# Patient Record
Sex: Male | Born: 1980 | Race: Black or African American | Hispanic: No | Marital: Married | Smoking: Never smoker
Health system: Southern US, Community
[De-identification: ages and names within clinical notes are randomized; demographics above are authoritative.]

## PROBLEM LIST (undated history)

## (undated) DIAGNOSIS — J302 Other seasonal allergic rhinitis: Secondary | ICD-10-CM

## (undated) HISTORY — PX: MANDIBLE FRACTURE SURGERY: SHX706

---
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
## 2000-04-11 ENCOUNTER — Encounter: Payer: Self-pay | Admitting: Emergency Medicine

## 2000-04-11 ENCOUNTER — Emergency Department (HOSPITAL_COMMUNITY): Admission: EM | Admit: 2000-04-11 | Discharge: 2000-04-11 | Payer: Self-pay | Admitting: Emergency Medicine

## 2000-08-31 ENCOUNTER — Emergency Department (HOSPITAL_COMMUNITY): Admission: EM | Admit: 2000-08-31 | Discharge: 2000-08-31 | Payer: Self-pay | Admitting: Emergency Medicine

## 2001-10-07 ENCOUNTER — Emergency Department (HOSPITAL_COMMUNITY): Admission: EM | Admit: 2001-10-07 | Discharge: 2001-10-07 | Payer: Self-pay | Admitting: Emergency Medicine

## 2003-08-04 ENCOUNTER — Emergency Department (HOSPITAL_COMMUNITY): Admission: EM | Admit: 2003-08-04 | Discharge: 2003-08-04 | Payer: Self-pay | Admitting: Emergency Medicine

## 2004-04-19 ENCOUNTER — Emergency Department (HOSPITAL_COMMUNITY): Admission: EM | Admit: 2004-04-19 | Discharge: 2004-04-19 | Payer: Self-pay | Admitting: Emergency Medicine

## 2004-12-12 ENCOUNTER — Emergency Department (HOSPITAL_COMMUNITY): Admission: EM | Admit: 2004-12-12 | Discharge: 2004-12-12 | Payer: Self-pay | Admitting: Family Medicine

## 2005-12-03 ENCOUNTER — Emergency Department (HOSPITAL_COMMUNITY): Admission: EM | Admit: 2005-12-03 | Discharge: 2005-12-03 | Payer: Self-pay | Admitting: Emergency Medicine

## 2006-09-02 ENCOUNTER — Emergency Department (HOSPITAL_COMMUNITY): Admission: EM | Admit: 2006-09-02 | Discharge: 2006-09-02 | Payer: Self-pay | Admitting: Emergency Medicine

## 2009-03-17 ENCOUNTER — Emergency Department (HOSPITAL_COMMUNITY): Admission: EM | Admit: 2009-03-17 | Discharge: 2009-03-17 | Payer: Self-pay | Admitting: Emergency Medicine

## 2009-07-04 ENCOUNTER — Emergency Department (HOSPITAL_COMMUNITY): Admission: EM | Admit: 2009-07-04 | Discharge: 2009-07-04 | Payer: Self-pay | Admitting: Emergency Medicine

## 2009-07-13 ENCOUNTER — Emergency Department (HOSPITAL_COMMUNITY): Admission: EM | Admit: 2009-07-13 | Discharge: 2009-07-13 | Payer: Self-pay | Admitting: Emergency Medicine

## 2009-07-20 ENCOUNTER — Emergency Department (HOSPITAL_COMMUNITY): Admission: EM | Admit: 2009-07-20 | Discharge: 2009-07-20 | Payer: Self-pay | Admitting: Emergency Medicine

## 2011-09-27 ENCOUNTER — Emergency Department (INDEPENDENT_AMBULATORY_CARE_PROVIDER_SITE_OTHER)
Admission: EM | Admit: 2011-09-27 | Discharge: 2011-09-27 | Disposition: A | Discharge status: Home / Self Care | Payer: Self-pay | Source: Home / Self Care | Attending: Emergency Medicine | Admitting: Emergency Medicine

## 2011-09-27 ENCOUNTER — Encounter (HOSPITAL_COMMUNITY): Payer: Self-pay

## 2011-09-27 DIAGNOSIS — K0520 Aggressive periodontitis, unspecified: Secondary | ICD-10-CM

## 2011-09-27 DIAGNOSIS — K05219 Aggressive periodontitis, localized, unspecified severity: Secondary | ICD-10-CM

## 2011-09-27 HISTORY — DX: Other seasonal allergic rhinitis: J30.2

## 2011-09-27 MED ORDER — PENICILLIN V POTASSIUM 500 MG PO TABS: 500.0000 mg | ORAL_TABLET | Freq: Four times a day (QID) | ORAL | Status: AC

## 2011-09-27 MED ORDER — IBUPROFEN 600 MG PO TABS: 600.0000 mg | ORAL_TABLET | Freq: Four times a day (QID) | ORAL | Status: AC | PRN

## 2011-09-27 MED ORDER — CHLORHEXIDINE GLUCONATE 0.12 % MT SOLN: OROMUCOSAL | Status: AC

## 2011-09-27 NOTE — ED Notes (Signed)
C/o abscess to upper gum that started 3 weeks ago, states took mom's leftover antibiotics and it went away, then returned.  States while waiting here, it drained.

## 2011-09-27 NOTE — Discharge Instructions (Signed)
You'll need to followup with Dr. Ninetta Lights, to have this tooth taken care of . Finish all of the antibiotics, even if you feel better. Return to the ER if you start having a severe headache if you've a fever above 100.4, or for any other concerns.

## 2011-09-27 NOTE — ED Provider Notes (Signed)
History     CSN: [id number]  Arrival date & time 09/27/11  1511   First MD Initiated Contact with Patient 09/27/11 1610      Chief Complaint  Patient presents with  . Abscess    (Consider location/radiation/quality/duration/timing/severity/associated sxs/prior treatment) HPI Comments: Patient reports a recurring painful swelling above his right upper central incisor for 3 weeks. States it comes and goes. Has been using mouthwash, and brushing his teeth without symptom improvement. Was concerned   that it was getting worse, but states it spontaneously drained while in waiting room. a patient has been extensively decayed tooth right below it. Denies any tooth pain. No nausea, vomiting, fevers, headaches, nasal congestion, trismus. Patient has had this over the past few months, and has taken penicillin for this the past, but has not taken any for this episode.   ROS as noted in HPI. All other ROS negative.   Patient is a 31 y.o. male presenting with abscess. The history is provided by the patient. No language interpreter was used.  Abscess  This is a recurrent problem. The current episode started more than one week ago. The onset was gradual. The problem has been gradually improving.    Past Medical History  Diagnosis Date  . Seasonal allergies     Past Surgical History  Procedure Date  . Mandible fracture surgery     History reviewed. No pertinent family history.  History  Substance Use Topics  . Smoking status: Never Smoker   . Smokeless tobacco: Not on file  . Alcohol Use: No      Review of Systems  Allergies  Review of patient's allergies indicates no known allergies.  Home Medications   Current Outpatient Rx  Name Route Sig Dispense Refill  . MULTIVITAMINS PO CAPS Oral Take 1 capsule by mouth daily.    . CHLORHEXIDINE GLUCONATE 0.12 % MT SOLN  15 mL swish and spit bid 120 mL 0  . IBUPROFEN 600 MG PO TABS Oral Take 1 tablet (600 mg total) by mouth every 6  (six) hours as needed for pain. 30 tablet 0  . PENICILLIN V POTASSIUM 500 MG PO TABS Oral Take 1 tablet (500 mg total) by mouth 4 (four) times daily. X 10 days 40 tablet 0    BP 125/76  Pulse 70  Temp(Src) 98.1 F (36.7 C) (Oral)  Resp 12  SpO2 100%  Physical Exam  Nursing note and vitals reviewed. Constitutional: He is oriented to person, place, and time. He appears well-developed and well-nourished.  HENT:  Head: Normocephalic and atraumatic. No trismus in the jaw.  Mouth/Throat: Oropharynx is clear and moist and mucous membranes are normal.         Erythema, swelling, central hole where abscess drain. No expressible purulent drainage. Extensively decayed right upper central incisor. Tooth is nontender to palpation, and is not loose.  Eyes: Conjunctivae and EOM are normal.  Neck: Normal range of motion.  Cardiovascular: Normal rate.   Pulmonary/Chest: Effort normal. No respiratory distress.  Abdominal: He exhibits no distension.  Musculoskeletal: Normal range of motion.  Neurological: He is alert and oriented to person, place, and time.  Skin: Skin is warm and dry.  Psychiatric: He has a normal mood and affect. His behavior is normal.    ED Course  Procedures (including critical care time)  Labs Reviewed - No data to display No results found.   1. Abscess of upper gum       MDM  Abscess spontaneously drained  while in waiting room. Patient nontoxic. Will send home with penicillin, chlorhexidine mouth wash. Will have him follow with Dr. Ninetta Lights, dentist on call.  Luiz Blare, MD 09/27/11 1719

## 2011-11-21 IMAGING — CR DG FOOT COMPLETE 3+V*R*
3 series · 3 of 3 positions shown · non-contrast
Comparison: None available.

CLINICAL DATA: Occult fracture of the right foot.  Foot injury 1
week ago.  Lateral foot pain.

RIGHT FOOT COMPLETE - 3+ VIEW

[view not recorded (1 of 3)]
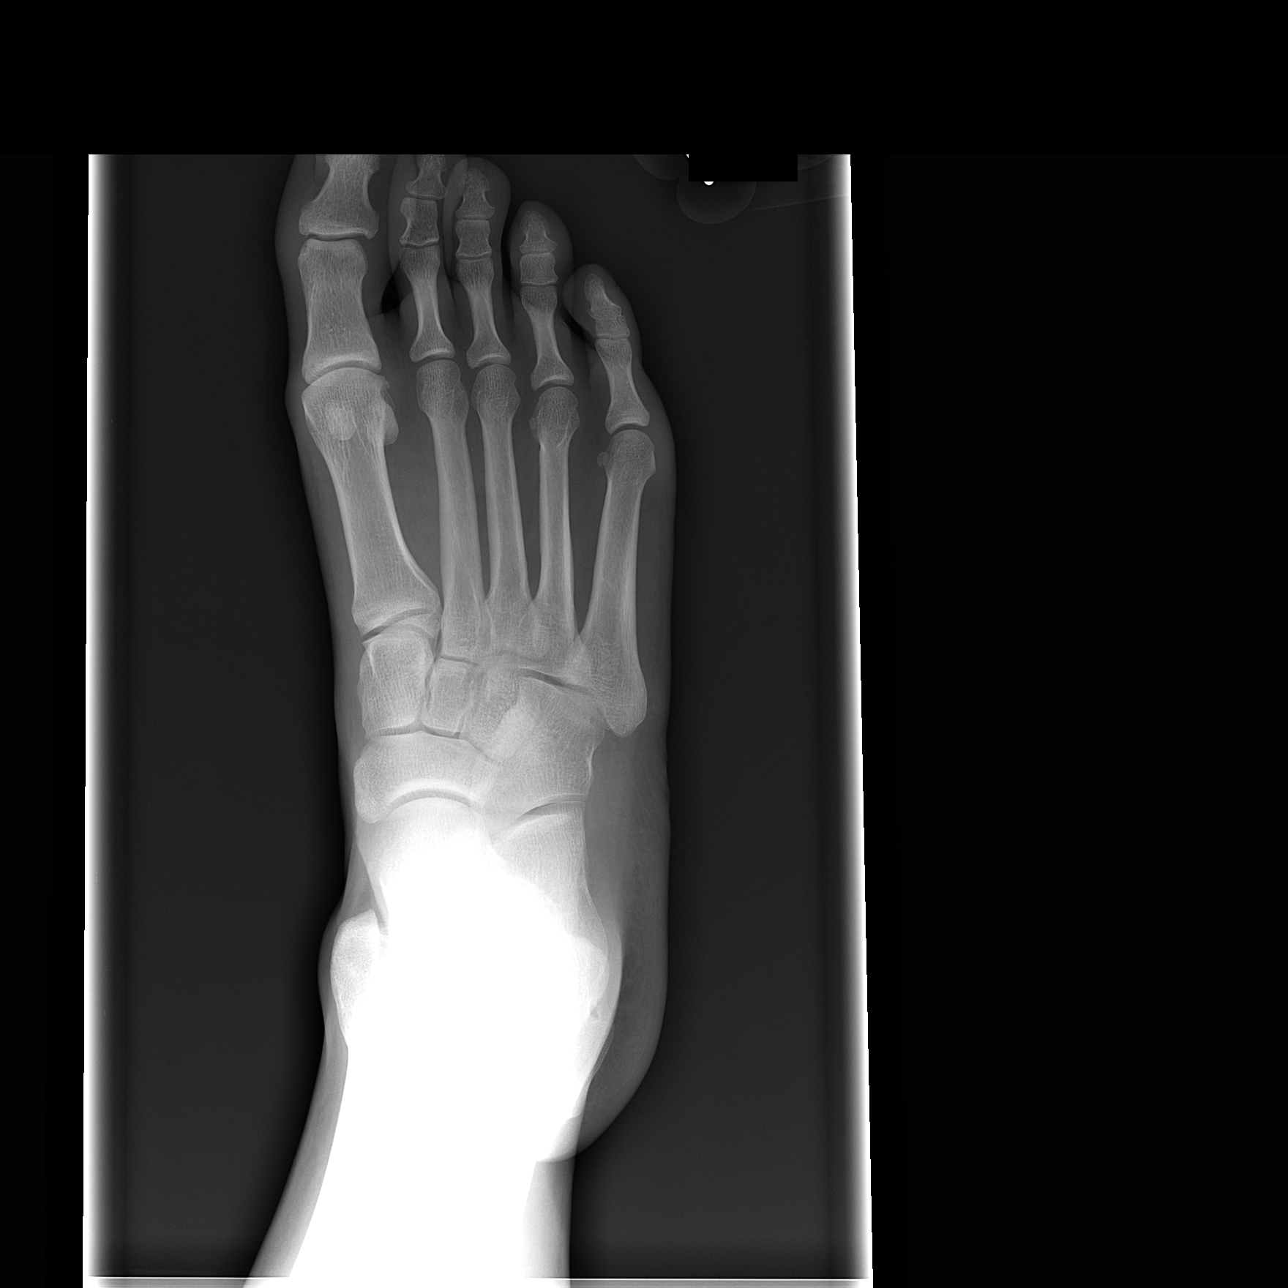

[view not recorded (2 of 3)]
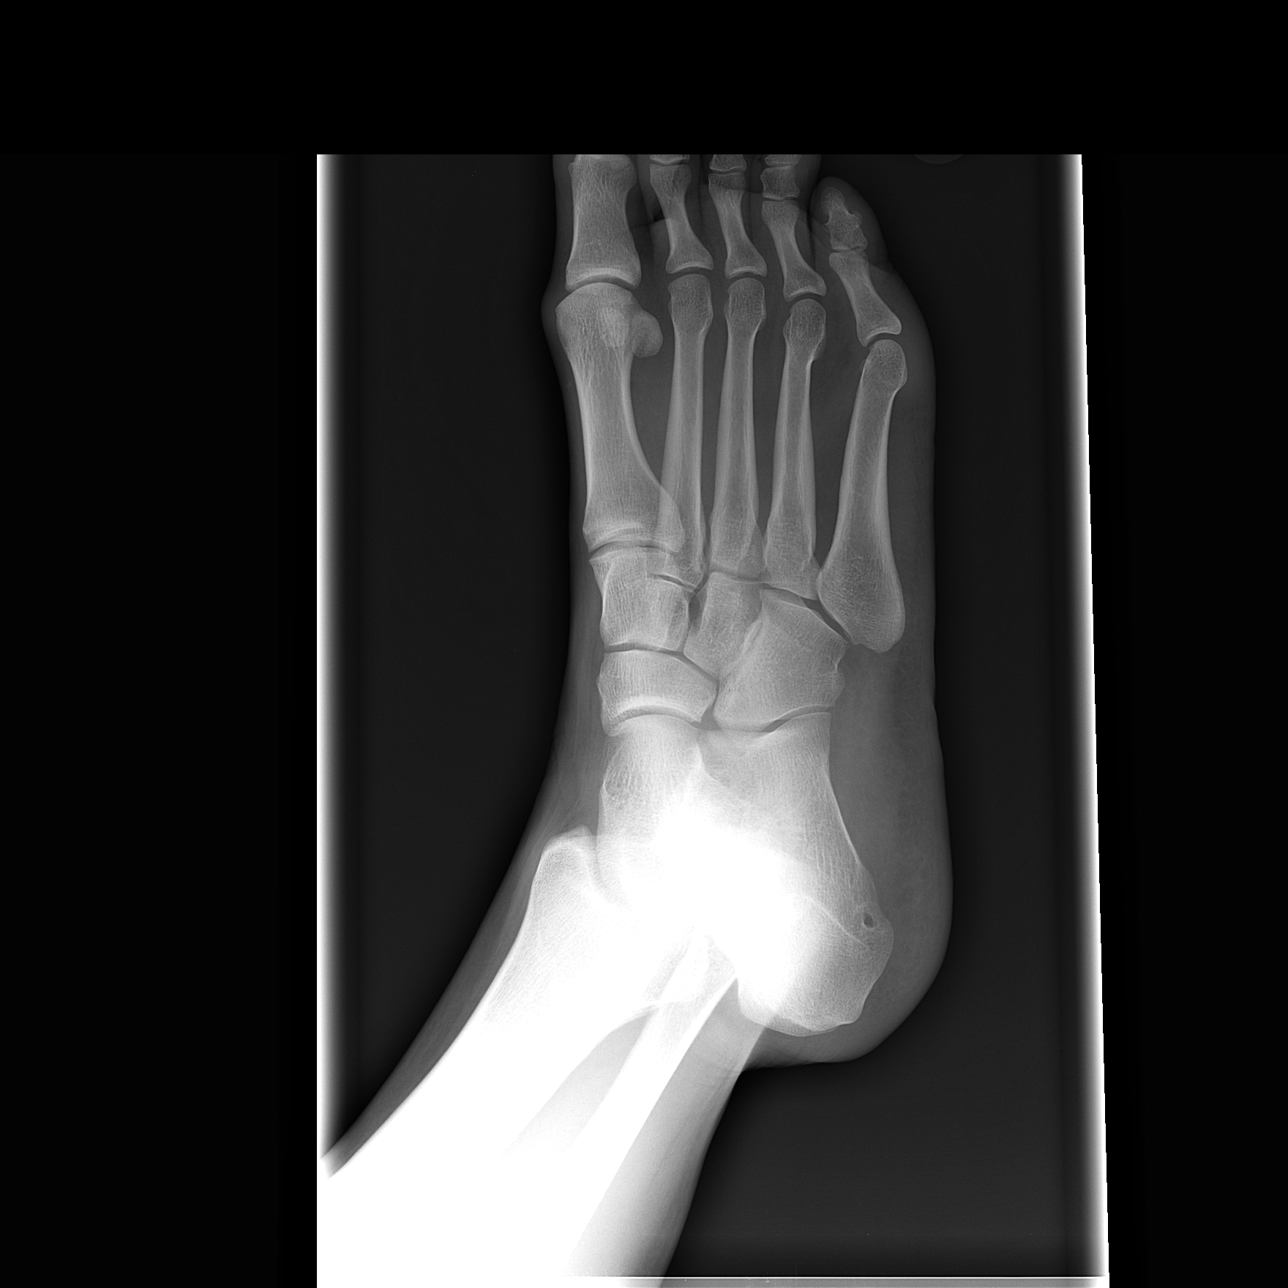

[view not recorded (3 of 3)]
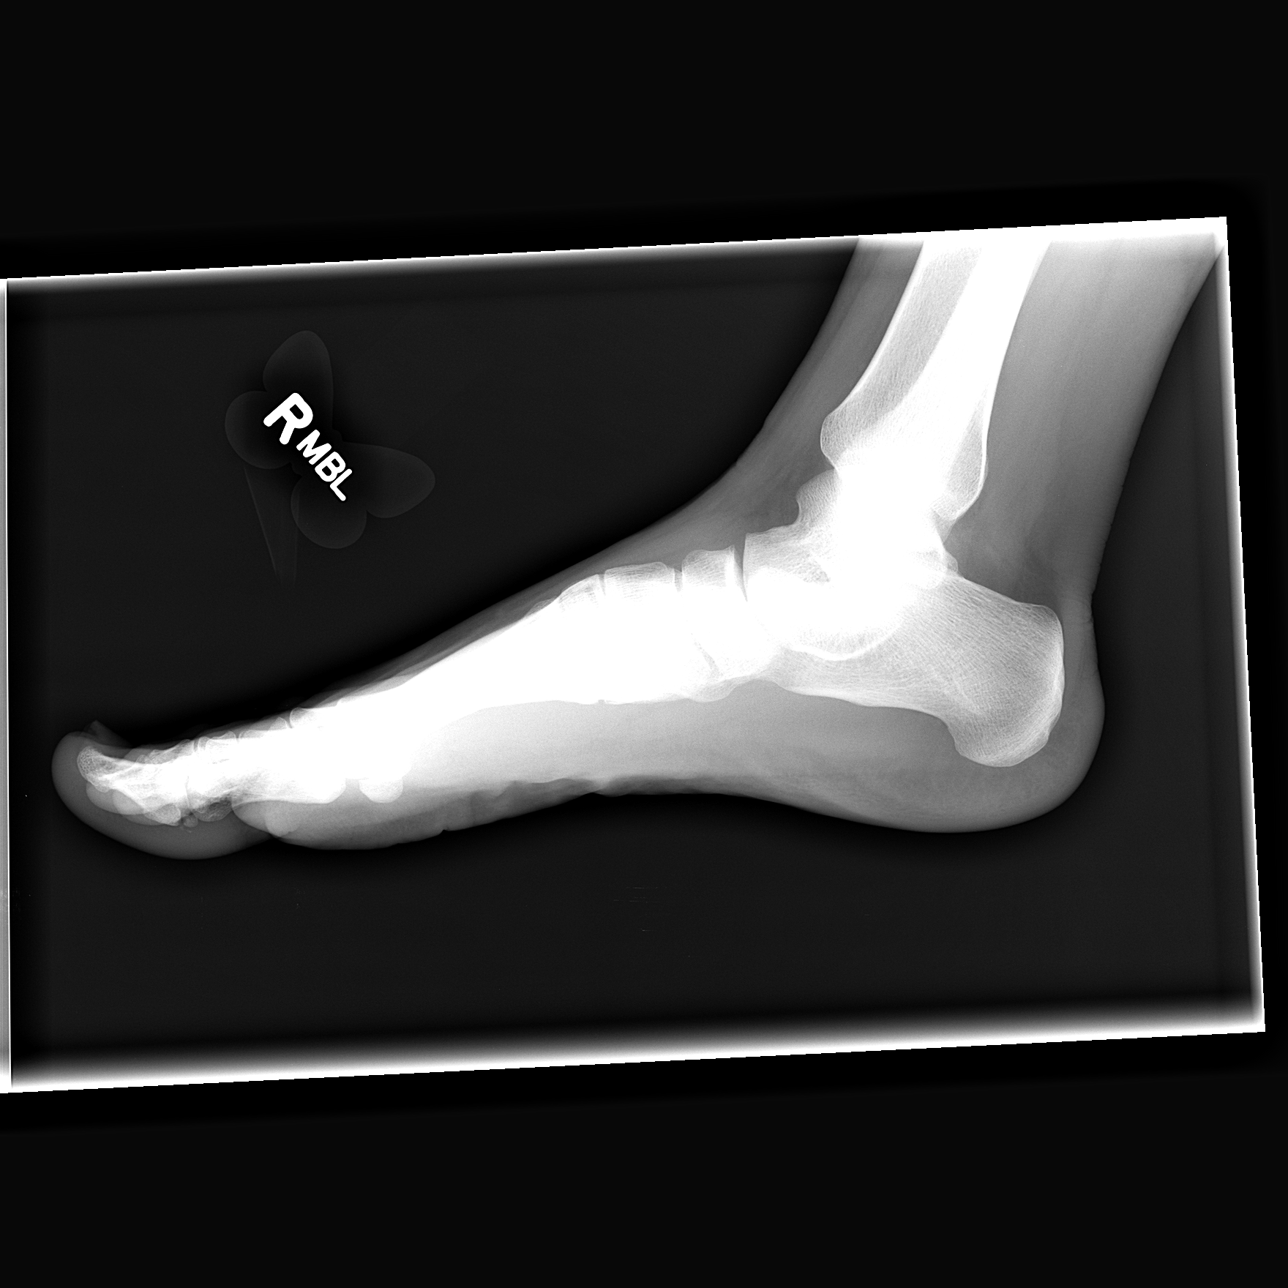

[3 of 3 positions shown; findings below may reference images not displayed]

FINDINGS: The alignment of the bones of the right foot is anatomic.
No fracture is identified.  There is no periosteal reaction to
suggest healing fracture.  Mild pes planus.
IMPRESSION: No evidence of acute, subacute or healing fracture of the foot.

## 2012-03-12 ENCOUNTER — Encounter (HOSPITAL_COMMUNITY): Payer: Self-pay | Admitting: *Deleted

## 2012-03-12 ENCOUNTER — Emergency Department (INDEPENDENT_AMBULATORY_CARE_PROVIDER_SITE_OTHER)
Admission: EM | Admit: 2012-03-12 | Discharge: 2012-03-12 | Disposition: A | Discharge status: Home / Self Care | Payer: Self-pay | Source: Home / Self Care | Attending: Emergency Medicine | Admitting: Emergency Medicine

## 2012-03-12 DIAGNOSIS — B86 Scabies: Secondary | ICD-10-CM

## 2012-03-12 MED ORDER — PERMETHRIN 5 % EX CREA: TOPICAL_CREAM | CUTANEOUS | Status: DC

## 2012-03-12 NOTE — ED Provider Notes (Signed)
Chief Complaint  Patient presents with  . Rash    History of Present Illness:   Dale Guerrero is a 31 year old male who has had a one-month history of a rash on his trunk, arms, and legs. He is noted rash and itching on the wrists, the webspace of the fingers and the genital area. It's intensely pruritic particularly at nighttime. His son's mother has had a similar rash and was diagnosed as having scabies. They have slept in the same bed together although not been intimate. He denies any difficulty breathing or swelling of the lips, tongue, or throat.  Review of Systems:  Other than noted above, the patient denies any of the following symptoms: Systemic:  No fever, chills, sweats, weight loss, or fatigue. ENT:  No nasal congestion, rhinorrhea, sore throat, swelling of lips, tongue or throat. Resp:  No cough, wheezing, or shortness of breath. Skin:  No rash, itching, nodules, or suspicious lesions.  PMFSH:  Past medical history, family history, social history, meds, and allergies were reviewed.  Physical Exam:   Vital signs:  BP 130/81  Pulse 58  Temp 98.1 F (36.7 C) (Oral)  Resp 18  SpO2 100% Gen:  Alert, oriented, in no distress. ENT:  Pharynx clear, no intraoral lesions, moist mucous membranes. Lungs:  Clear to auscultation. Skin:  He has scattered papules on the trunk, arms, and legs, particularly in the web spaces of the fingers. Genital exam is unremarkable.  Assessment:  The encounter diagnosis was Scabies.  Plan:   1.  The following meds were prescribed:   New Prescriptions   PERMETHRIN (ELIMITE) 5 % CREAM    Apply head to toe at bedtime, including skin fold areas, hands, feet and between fingers and toes.  Leave on for at least 8 hours.  Scrub off next morning.  Repeat this same procedure in 1 week.   2.  The patient was instructed in symptomatic care and handouts were given. 3.  The patient was told to return if becoming worse in any way, if no better in 3 or 4 days, and given  some red flag symptoms that would indicate earlier return. He was told to treat all family members, launder sheets, pillowcases, and bedclothes, underwear, and clothing, and spray the house with Rid spray.     Reuben Likes, MD 03/12/12 1300

## 2012-03-12 NOTE — ED Notes (Signed)
C/O intermittent, pruritic, generalized rash to entire body x approx 1 month.  Has not been taking any measures to help alleviate sxs.  Denies others in household having same concerns, but states his significant other has been itching some.

## 2012-07-01 ENCOUNTER — Encounter (HOSPITAL_COMMUNITY): Payer: Self-pay | Admitting: Emergency Medicine

## 2012-07-01 DIAGNOSIS — L03211 Cellulitis of face: Secondary | ICD-10-CM | POA: Insufficient documentation

## 2012-07-01 DIAGNOSIS — Z9889 Other specified postprocedural states: Secondary | ICD-10-CM | POA: Insufficient documentation

## 2012-07-01 DIAGNOSIS — L0201 Cutaneous abscess of face: Secondary | ICD-10-CM | POA: Insufficient documentation

## 2012-07-01 NOTE — ED Notes (Signed)
PT. REPORTS PROGRESSING ABSCESS AT LEFT JAW WITH SWELLING AND PURULENT DRAINAGE ONSET YESTERDAY.

## 2012-07-02 ENCOUNTER — Emergency Department (HOSPITAL_COMMUNITY)
Admission: EM | Admit: 2012-07-02 | Discharge: 2012-07-02 | Disposition: A | Discharge status: Home / Self Care | Payer: Self-pay | Attending: Emergency Medicine | Admitting: Emergency Medicine

## 2012-07-02 DIAGNOSIS — L0201 Cutaneous abscess of face: Secondary | ICD-10-CM

## 2012-07-02 MED ORDER — HYDROCODONE-ACETAMINOPHEN 5-325 MG PO TABS: 1.0000 | ORAL_TABLET | ORAL | Status: DC | PRN

## 2012-07-02 MED ORDER — SULFAMETHOXAZOLE-TRIMETHOPRIM 800-160 MG PO TABS: 1.0000 | ORAL_TABLET | Freq: Two times a day (BID) | ORAL | Status: DC

## 2012-07-02 MED ORDER — CEPHALEXIN 500 MG PO CAPS: 1000.0000 mg | ORAL_CAPSULE | Freq: Two times a day (BID) | ORAL | Status: DC

## 2012-07-02 MED ORDER — OXYCODONE-ACETAMINOPHEN 5-325 MG PO TABS
1.0000 | ORAL_TABLET | Freq: Once | ORAL | Status: AC
  Administered 2012-07-02: 1 via ORAL
  Filled 2012-07-02: qty 1

## 2012-07-02 NOTE — ED Provider Notes (Signed)
Medical screening examination/treatment/procedure(s) were performed by non-physician practitioner and as supervising physician I was immediately available for consultation/collaboration.  Sunnie Nielsen, MD 07/02/12 2259

## 2012-07-02 NOTE — ED Notes (Signed)
The pt had a bump on his lt face yesterday he squeezed it and now he has swelling and pain in that same area

## 2012-07-02 NOTE — ED Provider Notes (Signed)
History     CSN: [id number]  Arrival date & time 07/01/12  2346   First MD Initiated Contact with Patient 07/02/12 [phone]      Chief Complaint  Patient presents with  . Abscess    (Consider location/radiation/quality/duration/timing/severity/associated sxs/prior treatment) HPI History provided by pt.   Pt presents w/ abscess of left face.  Has had severe pain w/ associated edema at angle of jaw since yesterday am.  Has applied warm compresses and squeezed w/ a small amount of purulent drainage.   Denies fever, dental and mouth pain.  Denies trauma.  Attributes to shaving.  Past Medical History  Diagnosis Date  . Seasonal allergies     Past Surgical History  Procedure Date  . Mandible fracture surgery   . Mandible fracture surgery     No family history on file.  History  Substance Use Topics  . Smoking status: Never Smoker   . Smokeless tobacco: Not on file  . Alcohol Use: No      Review of Systems  All other systems reviewed and are negative.    Allergies  Review of patient's allergies indicates no known allergies.  Home Medications   Current Outpatient Rx  Name  Route  Sig  Dispense  Refill  . DIPHENHYDRAMINE HCL 25 MG PO CAPS   Oral   Take 25 mg by mouth at bedtime as needed. For allergies         . POLYVINYL ALCOHOL 1.4 % OP SOLN   Both Eyes   Place 1 drop into both eyes as needed. For itching or dryness         . CEPHALEXIN 500 MG PO CAPS   Oral   Take 2 capsules (1,000 mg total) by mouth 2 (two) times daily.   28 capsule   0   . HYDROCODONE-ACETAMINOPHEN 5-325 MG PO TABS   Oral   Take 1 tablet by mouth every 4 (four) hours as needed for pain.   20 tablet   0   . SULFAMETHOXAZOLE-TRIMETHOPRIM 800-160 MG PO TABS   Oral   Take 1 tablet by mouth 2 (two) times daily.   14 tablet   0     BP 142/86  Pulse 85  Temp 98.8 F (37.1 C) (Oral)  Resp 18  SpO2 99%  Physical Exam  Nursing note and vitals reviewed. Constitutional: He is  oriented to person, place, and time. He appears well-developed and well-nourished. No distress.  HENT:  Head: Normocephalic and atraumatic.       1cm non-fluctuant, non-draining, severely tender abscess of left cheek w/ surrounding edema, erythema and induration.  No edema or tenderness w/in mouth and nml dentition.    Eyes:       Normal appearance  Neck: Normal range of motion.  Pulmonary/Chest: Effort normal.  Musculoskeletal: Normal range of motion.  Lymphadenopathy:    He has no cervical adenopathy.  Neurological: He is alert and oriented to person, place, and time.  Psychiatric: He has a normal mood and affect. His behavior is normal.    ED Course  Procedures (including critical care time)  INCISION AND DRAINAGE Performed by: Ruby Cola E Consent: Verbal consent obtained. Risks and benefits: risks, benefits and alternatives were discussed Type: abscess  Body area: L cheek  Anesthesia: local infiltration  Incision was made with a scalpel.  Local anesthetic: lidocaine 2% w/ epinephrine  Anesthetic total: 2 ml  Complexity: simple   Drainage: purulent  Drainage amount: small  Packing material:  none  Patient tolerance: Patient tolerated the procedure well with no immediate complications.    Labs Reviewed - No data to display No results found.   1. Abscess of right external cheek       MDM  Pt presents w/ abscess of left cheek w/ narrow ring of surrounding cellulitis.  I&D'd and a small amount of pus drained.  Pain improved w/ po percocet.  Pt prescribed keflex and bactrim, particularly because such a small incision was made, as well as vicodin and return precautions discussed.  4:07 AM         Otilio Miu, PA-C 07/02/12 [phone]

## 2012-10-20 ENCOUNTER — Encounter (HOSPITAL_COMMUNITY): Payer: Self-pay | Admitting: Emergency Medicine

## 2012-10-20 ENCOUNTER — Emergency Department (INDEPENDENT_AMBULATORY_CARE_PROVIDER_SITE_OTHER)
Admission: EM | Admit: 2012-10-20 | Discharge: 2012-10-20 | Disposition: A | Discharge status: Home / Self Care | Payer: Self-pay | Source: Home / Self Care | Attending: Family Medicine | Admitting: Family Medicine

## 2012-10-20 DIAGNOSIS — N498 Inflammatory disorders of other specified male genital organs: Secondary | ICD-10-CM

## 2012-10-20 DIAGNOSIS — N492 Inflammatory disorders of scrotum: Secondary | ICD-10-CM

## 2012-10-20 MED ORDER — DOXYCYCLINE HYCLATE 100 MG PO CAPS: 100.0000 mg | ORAL_CAPSULE | Freq: Two times a day (BID) | ORAL | Status: DC

## 2012-10-20 NOTE — ED Provider Notes (Signed)
History     CSN: [id number]  Arrival date & time 10/20/12  1035   First MD Initiated Contact with Patient 10/20/12 1122      Chief Complaint  Patient presents with  . Abscess    HPI Patient is a 32 y.o. male presenting with abscess. The history is provided by the patient.  Abscess Location:  Ano-genital Ano-genital abscess location:  Scrotum Abscess quality: painful and redness   Abscess quality: not draining, no fluctuance, no induration, no itching, no warmth and not weeping   Red streaking: no   Duration:  4 days Progression:  Worsening Pain details:    Severity:  Moderate   Duration:  4 days   Timing:  Constant   Progression:  Worsening Chronicity:  Recurrent Context: not diabetes, not immunosuppression, not injected drug use, not insect bite/sting and not skin injury   Relieved by:  None tried Worsened by:  Nothing tried Ineffective treatments:  None tried Associated symptoms: no anorexia, no fatigue, no fever, no headaches, no nausea and no vomiting   Risk factors: prior abscess   Pt reports noticing a small teneder "bump" on his scrotum on Tuesday that has become larger and more tender. Pt has a h/o an abscess in the past and states he wanted to get to it early because last time he had to have the abscess cut open and drained. Denies fever or other c/o's.  Past Medical History  Diagnosis Date  . Seasonal allergies     Past Surgical History  Procedure Laterality Date  . Mandible fracture surgery    . Mandible fracture surgery      No family history on file.  History  Substance Use Topics  . Smoking status: Never Smoker   . Smokeless tobacco: Not on file  . Alcohol Use: No      Review of Systems  Constitutional: Negative for fever and fatigue.  Gastrointestinal: Negative for nausea, vomiting and anorexia.  Neurological: Negative for headaches.  All other systems reviewed and are negative.    Allergies  Review of patient's allergies indicates no  known allergies.  Home Medications   Current Outpatient Rx  Name  Route  Sig  Dispense  Refill  . cephALEXin (KEFLEX) 500 MG capsule   Oral   Take 2 capsules (1,000 mg total) by mouth 2 (two) times daily.   28 capsule   0   . diphenhydrAMINE (BENADRYL) 25 mg capsule   Oral   Take 25 mg by mouth at bedtime as needed. For allergies         . HYDROcodone-acetaminophen (NORCO/VICODIN) 5-325 MG per tablet   Oral   Take 1 tablet by mouth every 4 (four) hours as needed for pain.   20 tablet   0   . polyvinyl alcohol (LIQUIFILM TEARS) 1.4 % ophthalmic solution   Both Eyes   Place 1 drop into both eyes as needed. For itching or dryness         . sulfamethoxazole-trimethoprim (SEPTRA DS) 800-160 MG per tablet   Oral   Take 1 tablet by mouth 2 (two) times daily.   14 tablet   0     BP 152/77  Pulse 66  Temp(Src) 98.1 F (36.7 C) (Oral)  Resp 18  SpO2 98%  Physical Exam  Constitutional: He is oriented to person, place, and time. He appears well-developed and well-nourished.  HENT:  Head: Normocephalic and atraumatic.  Eyes: Conjunctivae are normal.  Neck: Neck supple.  Cardiovascular: Normal  rate.   Pulmonary/Chest: Effort normal.  Genitourinary:     Small approx 1 cm slightly raised erythematous nodule to (R) scrotum that is TTP. No induration, local cellulitis or palpable fluctuance.   Musculoskeletal: Normal range of motion.  Neurological: He is alert and oriented to person, place, and time.  Skin: Skin is warm and dry.  Psychiatric: He has a normal mood and affect.    ED Course  Procedures (including critical care time)  Labs Reviewed - No data to display No results found.   No diagnosis found.    MDM  4 days h/o small tender nodule to (R) scrotal area. No fluctuance. No localized cellulitis. Will treat w/ doxycycline and encouraged frequent warm soaks . Pt encouraged to return if area does not improve or worsens. Pt agreeable.           Leanne Chang, NP 10/20/12 1200

## 2012-10-20 NOTE — ED Provider Notes (Signed)
Medical screening examination/treatment/procedure(s) were performed by non-physician practitioner and as supervising physician I was immediately available for consultation/collaboration.   [facility]; MD  Sharin Grave, MD 10/20/12 1515

## 2012-10-20 NOTE — ED Notes (Signed)
Pt is here for a poss ingrown hair on right scrotum onset 2 days Sx include irritation and mild pain w/activity Denies: drainage, f/v/n/d  He is alert and oriented w/no signs of acute distress.

## 2013-07-05 ENCOUNTER — Encounter (HOSPITAL_COMMUNITY): Payer: Self-pay | Admitting: Emergency Medicine

## 2013-07-05 ENCOUNTER — Emergency Department (INDEPENDENT_AMBULATORY_CARE_PROVIDER_SITE_OTHER)
Admission: EM | Admit: 2013-07-05 | Discharge: 2013-07-05 | Disposition: A | Discharge status: Home / Self Care | Payer: Self-pay | Source: Home / Self Care

## 2013-07-05 DIAGNOSIS — K0889 Other specified disorders of teeth and supporting structures: Secondary | ICD-10-CM

## 2013-07-05 DIAGNOSIS — K089 Disorder of teeth and supporting structures, unspecified: Secondary | ICD-10-CM

## 2013-07-05 DIAGNOSIS — K0510 Chronic gingivitis, plaque induced: Secondary | ICD-10-CM

## 2013-07-05 MED ORDER — AMOXICILLIN 500 MG PO CAPS: 1000.0000 mg | ORAL_CAPSULE | Freq: Two times a day (BID) | ORAL | Status: DC

## 2013-07-05 MED ORDER — HYDROCODONE-ACETAMINOPHEN 5-325 MG PO TABS: 1.0000 | ORAL_TABLET | ORAL | Status: DC | PRN

## 2013-07-05 NOTE — Discharge Instructions (Signed)
Dental Pain °A tooth ache may be caused by cavities (tooth decay). Cavities expose the nerve of the tooth to air and hot or cold temperatures. It may come from an infection or abscess (also called a boil or furuncle) around your tooth. It is also often caused by dental caries (tooth decay). This causes the pain you are having. °DIAGNOSIS  °Your caregiver can diagnose this problem by exam. °TREATMENT  °· If caused by an infection, it may be treated with medications which kill germs (antibiotics) and pain medications as prescribed by your caregiver. Take medications as directed. °· Only take over-the-counter or prescription medicines for pain, discomfort, or fever as directed by your caregiver. °· Whether the tooth ache today is caused by infection or dental disease, you should see your dentist as soon as possible for further care. °SEEK MEDICAL CARE IF: °The exam and treatment you received today has been provided on an emergency basis only. This is not a substitute for complete medical or dental care. If your problem worsens or new problems (symptoms) appear, and you are unable to meet with your dentist, call or return to this location. °SEEK IMMEDIATE MEDICAL CARE IF:  °· You have a fever. °· You develop redness and swelling of your face, jaw, or neck. °· You are unable to open your mouth. °· You have severe pain uncontrolled by pain medicine. °MAKE SURE YOU:  °· Understand these instructions. °· Will watch your condition. °· Will get help right away if you are not doing well or get worse. °Document Released: 06/07/2005 Document Revised: 08/30/2011 Document Reviewed: 01/24/2008 °[employer]® Patient Information ©2014 [employer]. ° °Dental Care and Dentist Visits °Dental care supports good overall health. Regular dental visits can also help you avoid dental pain, bleeding, infection, and other more serious health problems in the future. It is important to keep the mouth healthy because diseases in the teeth, gums,  and other oral tissues can spread to other areas of the body. Some problems, such as diabetes, heart disease, and pre-term labor have been associated with poor oral health.  °See your dentist every 6 months. If you experience emergency problems such as a toothache or broken tooth, go to the dentist right away. If you see your dentist regularly, you may catch problems early. It is easier to be treated for problems in the early stages.  °WHAT TO EXPECT AT A DENTIST VISIT  °Your dentist will look for many common oral health problems and recommend proper treatment. At your regular dental visit, you can expect: °· Gentle cleaning of the teeth and gums. This includes scraping and polishing. This helps to remove the sticky substance around the teeth and gums (plaque). Plaque forms in the mouth shortly after eating. Over time, plaque hardens on the teeth as tartar. If tartar is not removed regularly, it can cause problems. Cleaning also helps remove stains. °· Periodic X-rays. These pictures of the teeth and supporting bone will help your dentist assess the health of your teeth. °· Periodic fluoride treatments. Fluoride is a natural mineral shown to help strengthen teeth. Fluoride treatment involves applying a fluoride gel or varnish to the teeth. It is most commonly done in children. °· Examination of the mouth, tongue, jaws, teeth, and gums to look for any oral health problems, such as: °· Cavities (dental caries). This is decay on the tooth caused by plaque, sugar, and acid in the mouth. It is best to catch a cavity when it is small. °· Inflammation of the gums   caused by plaque buildup (gingivitis).  Problems with the mouth or malformed or misaligned teeth.  Oral cancer or other diseases of the soft tissues or jaws. KEEP YOUR TEETH AND GUMS HEALTHY For healthy teeth and gums, follow these general guidelines as well as your dentist's specific advice:  Have your teeth professionally cleaned at the dentist every 6  months.  Brush twice daily with a fluoride toothpaste.  Floss your teeth daily.  Ask your dentist if you need fluoride supplements, treatments, or fluoride toothpaste.  Eat a healthy diet. Reduce foods and drinks with added sugar.  Avoid smoking. TREATMENT FOR ORAL HEALTH PROBLEMS If you have oral health problems, treatment varies depending on the conditions present in your teeth and gums.  Your caregiver will most likely recommend good oral hygiene at each visit.  For cavities, gingivitis, or other oral health disease, your caregiver will perform a procedure to treat the problem. This is typically done at a separate appointment. Sometimes your caregiver will refer you to another dental specialist for specific tooth problems or for surgery. SEEK IMMEDIATE DENTAL CARE IF:  You have pain, bleeding, or soreness in the gum, tooth, jaw, or mouth area.  A permanent tooth becomes loose or separated from the gum socket.  You experience a blow or injury to the mouth or jaw area. Document Released: 02/17/2011 Document Revised: 08/30/2011 Document Reviewed: 02/17/2011 [facility] Patient Information 2014 [place], [place].  Gingivitis Gingivitis is a form of gum (periodontal) disease that causes redness, soreness, and swelling (inflammation) of your gums. CAUSES The most common cause of gingivitis is poor oral hygiene. A sticky substance made of bacteria, mucus, and food particles (plaque), is deposited on the exposed part of teeth. As plaque builds up, it reacts with the saliva in your mouth to form something called  tartar. Tartar is a hard deposit that becomes trapped around the base of the tooth. Plaque and tartar irritate the gums, leading to the formation of gingivitis. Other factors that increase your risk for gingivitis include:   Tobacco use.  Diabetes.  Older age.  Certain medications.  Certain viral or fungal infections.  Dry mouth.  Hormonal changes such as during  pregnancy.  Poor nutrition.  Substance abuse.  Poor fitting dental restorations or appliances. SYMPTOMS You may notice inflammation of the soft tissue (gingiva) around the teeth. When these tissues become inflamed, they bleed easily, especially during flossing or brushing. The gums may also be:   Tender to the touch.  Bright red, purple red, or have a shiny appearance.  Swollen.  Wearing away from the teeth (receding), which exposes more of the tooth. Bad breath is often present. Continued infection around teeth can eventually cause cavities and loosen teeth. This may lead to eventual tooth loss. DIAGNOSIS A medical and dental history will be taken. Your mouth, teeth, and gums will be examined. Your dentist will look for soft, swollen purple-red, irritated gums. There may be deposits of plaque and tartar at the base of the teeth. Your gums will be looked at for the degree of redness, puffiness, and bleeding tendencies. Your dentist will see if any of the teeth are loose. X-rays may be taken to see if the inflammation has spread to the supporting structures of the teeth. TREATMENT The goal is to reduce and reverse the inflammation. Proper treatment can usually reverse the symptoms of gingivitis and prevent further progression of the disease. Have your teeth cleaned. During the cleaning, all plaque and tartar will be removed. Instruction for  proper home care will be given. You will need regular professional cleanings and check-ups in the future. HOME CARE INSTRUCTIONS  Brush your teeth twice a day and floss at least once per day. When flossing, it is best to floss first then brush.  Limit sugar between meals and maintain a well-balanced diet.  Even the best dental hygiene will not prevent plaque from developing. It is necessary for you to see your dentist on a regular basis for cleaning and regular checkups.  Your dentist can recommend proper oral hygiene and mouth care and suggest  special toothpastes or mouth rinses.  Stop smoking. SEEK DENTAL OR MEDICAL CARE IF:  You have painful, reddened tissue around your teeth, or you have puffy swollen gums.  You have difficulty chewing.  You notice any loose or infected teeth.  You have swollen glands.  Your gums bleed easily when you brush your teeth or are very tender to the touch. Document Released: 12/01/2000 Document Revised: 08/30/2011 Document Reviewed: 09/11/2010 [facility] Patient Information 2014 [place], [place].

## 2013-07-05 NOTE — ED Provider Notes (Signed)
CSN: [id number]     Arrival date & time 07/05/13  1016 History   First MD Initiated Contact with Patient 07/05/13 1120     Chief Complaint  Patient presents with  . Dental Problem   (Consider location/radiation/quality/duration/timing/severity/associated sxs/prior Treatment) HPI Comments: Complains of toothache primarily involving the left upper incisor for several days. He states a portion of this tooth fractured  6-7 months ago. It appears to be worn down to below the gingival line.   Past Medical History  Diagnosis Date  . Seasonal allergies    Past Surgical History  Procedure Laterality Date  . Mandible fracture surgery    . Mandible fracture surgery     History reviewed. No pertinent family history. History  Substance Use Topics  . Smoking status: Never Smoker   . Smokeless tobacco: Not on file  . Alcohol Use: No    Review of Systems  Constitutional: Negative.   HENT: Positive for dental problem.   Respiratory: Negative.   Neurological: Negative.     Allergies  Review of patient's allergies indicates no known allergies.  Home Medications   Current Outpatient Rx  Name  Route  Sig  Dispense  Refill  . amoxicillin (AMOXIL) 500 MG capsule   Oral   Take 2 capsules (1,000 mg total) by mouth 2 (two) times daily.   28 capsule   0   . diphenhydrAMINE (BENADRYL) 25 mg capsule   Oral   Take 25 mg by mouth at bedtime as needed. For allergies         . HYDROcodone-acetaminophen (NORCO/VICODIN) 5-325 MG per tablet   Oral   Take 1 tablet by mouth every 4 (four) hours as needed for pain.   20 tablet   0   . HYDROcodone-acetaminophen (NORCO/VICODIN) 5-325 MG per tablet   Oral   Take 1 tablet by mouth every 4 (four) hours as needed.   15 tablet   0   . polyvinyl alcohol (LIQUIFILM TEARS) 1.4 % ophthalmic solution   Both Eyes   Place 1 drop into both eyes as needed. For itching or dryness          BP 146/85  Pulse 75  Temp(Src) 98.6 F (37 C) (Oral)   Resp 16  SpO2 100% Physical Exam  Nursing note and vitals reviewed. Constitutional: He is oriented to person, place, and time. He appears well-developed and well-nourished. He appears distressed.  HENT:  Mouth/Throat: Oropharynx is clear and moist.  Left upper incisor is absent and 80 and cavernous structure remains just below the gingival line. There is surrounding gingival swelling and mild erythema. No actual abscess is observed. The surrounding teeth are tender as well.  Eyes: Conjunctivae and EOM are normal.  Neck: Normal range of motion. Neck supple.  Neurological: He is alert and oriented to person, place, and time.  Skin: Skin is warm and dry.    ED Course  Procedures (including critical care time) Labs Review Labs Reviewed - No data to display Imaging Review No results found.    MDM   1. Toothache   2. Gingivitis   3. Poor dentition     Instructed to see dentist as soon as possible. Norco 5 mg every 4-6 hours when necessary #15 Amoxicillin 1 g twice a day for 7 days     Hayden Rasmussenavid Dyami Umbach, NP 07/05/13 1147

## 2013-07-05 NOTE — ED Notes (Signed)
Pt  Has   Dental caries  Had  Tooth ache  Last  Pm  Now  Has  Swelling  Poss  abcess             Pt  Reports  This  Tooth  Broke   Fairly  Recently

## 2013-07-06 NOTE — ED Provider Notes (Signed)
Medical screening examination/treatment/procedure(s) were performed by a resident physician or non-physician practitioner and as the supervising physician I was immediately available for consultation/collaboration.  Saramarie Stinger, MD    Trenace Coughlin S Mars Scheaffer, MD 07/06/13 0756 

## 2013-09-19 ENCOUNTER — Encounter (HOSPITAL_COMMUNITY): Payer: Self-pay | Admitting: Emergency Medicine

## 2013-09-19 ENCOUNTER — Emergency Department (INDEPENDENT_AMBULATORY_CARE_PROVIDER_SITE_OTHER)
Admission: EM | Admit: 2013-09-19 | Discharge: 2013-09-19 | Disposition: A | Discharge status: Home / Self Care | Payer: Managed Care, Other (non HMO) | Source: Home / Self Care | Attending: Family Medicine | Admitting: Family Medicine

## 2013-09-19 DIAGNOSIS — L0291 Cutaneous abscess, unspecified: Secondary | ICD-10-CM

## 2013-09-19 DIAGNOSIS — L0390 Cellulitis, unspecified: Principal | ICD-10-CM

## 2013-09-19 MED ORDER — KETOROLAC TROMETHAMINE 60 MG/2ML IM SOLN
60.0000 mg | Freq: Once | INTRAMUSCULAR | Status: AC
  Administered 2013-09-19: 60 mg via INTRAMUSCULAR

## 2013-09-19 MED ORDER — DOXYCYCLINE HYCLATE 100 MG PO CAPS: 100.0000 mg | ORAL_CAPSULE | Freq: Two times a day (BID) | ORAL | Status: DC

## 2013-09-19 MED ORDER — KETOROLAC TROMETHAMINE 60 MG/2ML IM SOLN
INTRAMUSCULAR | Status: AC
  Filled 2013-09-19: qty 2

## 2013-09-19 NOTE — Discharge Instructions (Signed)
Thank you for coming in today. Taking doxycycline twice daily for 7-10 days. Return if worsening or not improving. Use warm compresses much as possible  Abscess An abscess is an infected area that contains a collection of pus and debris.It can occur in almost any part of the body. An abscess is also known as a furuncle or boil. CAUSES  An abscess occurs when tissue gets infected. This can occur from blockage of oil or sweat glands, infection of hair follicles, or a minor injury to the skin. As the body tries to fight the infection, pus collects in the area and creates pressure under the skin. This pressure causes pain. People with weakened immune systems have difficulty fighting infections and get certain abscesses more often.  SYMPTOMS Usually an abscess develops on the skin and becomes a painful mass that is red, warm, and tender. If the abscess forms under the skin, you may feel a moveable soft area under the skin. Some abscesses break open (rupture) on their own, but most will continue to get worse without care. The infection can spread deeper into the body and eventually into the bloodstream, causing you to feel ill.  DIAGNOSIS  Your caregiver will take your medical history and perform a physical exam. A sample of fluid may also be taken from the abscess to determine what is causing your infection. TREATMENT  Your caregiver may prescribe antibiotic medicines to fight the infection. However, taking antibiotics alone usually does not cure an abscess. Your caregiver may need to make a small cut (incision) in the abscess to drain the pus. In some cases, gauze is packed into the abscess to reduce pain and to continue draining the area. HOME CARE INSTRUCTIONS   Only take over-the-counter or prescription medicines for pain, discomfort, or fever as directed by your caregiver.  If you were prescribed antibiotics, take them as directed. Finish them even if you start to feel better.  If gauze is  used, follow your caregiver's directions for changing the gauze.  To avoid spreading the infection:  Keep your draining abscess covered with a bandage.  Wash your hands well.  Do not share personal care items, towels, or whirlpools with others.  Avoid skin contact with others.  Keep your skin and clothes clean around the abscess.  Keep all follow-up appointments as directed by your caregiver. SEEK MEDICAL CARE IF:   You have increased pain, swelling, redness, fluid drainage, or bleeding.  You have muscle aches, chills, or a general ill feeling.  You have a fever. MAKE SURE YOU:   Understand these instructions.  Will watch your condition.  Will get help right away if you are not doing well or get worse. Document Released: 03/17/2005 Document Revised: 12/07/2011 Document Reviewed: 08/20/2011 [facility] Patient Information 2014 [place], [place].  Cellulitis Cellulitis is an infection of the skin and the tissue beneath it. The infected area is usually red and tender. Cellulitis occurs most often in the arms and lower legs.  CAUSES  Cellulitis is caused by bacteria that enter the skin through cracks or cuts in the skin. The most common types of bacteria that cause cellulitis are Staphylococcus and Streptococcus. SYMPTOMS   Redness and warmth.  Swelling.  Tenderness or pain.  Fever. DIAGNOSIS  Your caregiver can usually determine what is wrong based on a physical exam. Blood tests may also be done. TREATMENT  Treatment usually involves taking an antibiotic medicine. HOME CARE INSTRUCTIONS   Take your antibiotics as directed. Finish them even if you  start to feel better.  Keep the infected arm or leg elevated to reduce swelling.  Apply a warm cloth to the affected area up to 4 times per day to relieve pain.  Only take over-the-counter or prescription medicines for pain, discomfort, or fever as directed by your caregiver.  Keep all follow-up appointments as directed by  your caregiver. SEEK MEDICAL CARE IF:   You notice red streaks coming from the infected area.  Your red area gets larger or turns dark in color.  Your bone or joint underneath the infected area becomes painful after the skin has healed.  Your infection returns in the same area or another area.  You notice a swollen bump in the infected area.  You develop new symptoms. SEEK IMMEDIATE MEDICAL CARE IF:   You have a fever.  You feel very sleepy.  You develop vomiting or diarrhea.  You have a general ill feeling (malaise) with muscle aches and pains. MAKE SURE YOU:   Understand these instructions.  Will watch your condition.  Will get help right away if you are not doing well or get worse. Document Released: 03/17/2005 Document Revised: 12/07/2011 Document Reviewed: 08/23/2011 [facility] Patient Information 2014 [place], [place].

## 2013-09-19 NOTE — ED Notes (Signed)
C/o boil on left on buttocks States it was a little bump at first

## 2013-09-19 NOTE — ED Provider Notes (Signed)
Dale Guerrero is a 33 y.o. male who presents to Urgent Care today for left buttock pain. Patient developed a small painful papule on his left buttocks of the last 2 days. Yesterday drained spontaneously. The pain has persisted. Symptoms are moderate to severe hoarseness eating. No fevers or chills nausea vomiting or diarrhea. Patient feels well otherwise. No treatments tried yet.   Past Medical History  Diagnosis Date  . Seasonal allergies    History  Substance Use Topics  . Smoking status: Never Smoker   . Smokeless tobacco: Not on file  . Alcohol Use: No   ROS as above Medications: Current Facility-Administered Medications  Medication Dose Route Frequency Provider Last Rate Last Dose  . ketorolac (TORADOL) injection 60 mg  60 mg Intramuscular Once Rodolph BongEvan S Golda Zavalza, MD       Current Outpatient Prescriptions  Medication Sig Dispense Refill  . doxycycline (VIBRAMYCIN) 100 MG capsule Take 1 capsule (100 mg total) by mouth 2 (two) times daily.  20 capsule  0  . polyvinyl alcohol (LIQUIFILM TEARS) 1.4 % ophthalmic solution Place 1 drop into both eyes as needed. For itching or dryness      . [DISCONTINUED] diphenhydrAMINE (BENADRYL) 25 mg capsule Take 25 mg by mouth at bedtime as needed. For allergies        Exam:  BP 131/79  Pulse 83  Temp(Src) 97.8 F (36.6 C) (Oral)  Resp 18  SpO2 100% Gen: Well NAD Skin: Left buttocks. Quarter size area of induration erythema and tenderness with central crusting. No fluctuance palpated.  No results found for this or any previous visit (from the past 24 hour(s)). No results found.  Assessment and Plan: 33 y.o. male with abscess and cellulitis. Abscess seems to have drained spontaneously. No area of fluctuance for me to incise and drain at this time. Plan to use warm compress, and doxycycline. Followup if not improved or worsening. Toradol shot now for pain control.  Discussed warning signs or symptoms. Please see discharge instructions. Patient  expresses understanding.    Rodolph BongEvan S Nathifa Ritthaler, MD 09/19/13 [phone]

## 2014-07-05 ENCOUNTER — Emergency Department (INDEPENDENT_AMBULATORY_CARE_PROVIDER_SITE_OTHER)
Admission: EM | Admit: 2014-07-05 | Discharge: 2014-07-05 | Disposition: A | Discharge status: Home / Self Care | Payer: Managed Care, Other (non HMO) | Source: Home / Self Care | Attending: Family Medicine | Admitting: Family Medicine

## 2014-07-05 ENCOUNTER — Encounter (HOSPITAL_COMMUNITY): Payer: Self-pay | Admitting: Emergency Medicine

## 2014-07-05 DIAGNOSIS — J069 Acute upper respiratory infection, unspecified: Secondary | ICD-10-CM

## 2014-07-05 DIAGNOSIS — R030 Elevated blood-pressure reading, without diagnosis of hypertension: Secondary | ICD-10-CM

## 2014-07-05 MED ORDER — IPRATROPIUM BROMIDE 0.06 % NA SOLN: 2.0000 | Freq: Four times a day (QID) | NASAL | Status: DC

## 2014-07-05 NOTE — Discharge Instructions (Signed)
Thank you for coming in today. Call or go to the emergency room if you get worse, have trouble breathing, have chest pains, or palpitations.  Follow-up with a primary care doctor in the near future to recheck blood pressure Use Atrovent nasal spray for runny nose  PRIMARY CARE Merchant navy officerDOCTORS [facility] [address]  [place], [place] Ph [phone]  Fax [phone]  Nature conservation [facility] at [facility] [address]  [place], [place] Ph [phone]  Fax [phone]  [facility] conservation [facility] at [place] [facility] [place] [phone]  [place], [place] Ph [phone]  Fax [phone]  [facility] [address]  [place], [place] Ph [phone]  Fax [phone]  [facility] [address] [place]  [place], [place] Ph [phone]  Fax [phone]  [facility] [address] [place]  [place], [place] Ph [phone]  Fax [phone]   [facility] [place] [address] [place] [address] Phone: [phone]   [facility] [address] [place] [address] Phone: [phone]   [facility] [place] [address] [place] [address] Phone: [phone]   [facility] @ [place] [address] [place] [address] Phone: [phone]   [facility] @ [place] [address] [place] [address] Phone: [phone] Fax: [phone]   [facility] @ [place] [address] [place] [address] Phone: [phone]   Dr. Maryelizabeth RowanElizabeth Dewey [address] [place] [address] [phone]

## 2014-07-05 NOTE — ED Notes (Signed)
Concerns for elevated blood pressure, patient has a uri and using over the counter medicines

## 2014-07-05 NOTE — ED Provider Notes (Signed)
Dale Guerrero is a 34 y.o. male who presents to Urgent Care today for elevated blood pressure. Patient was seen and his dentist office this morning and found to have elevated blood pressure in the 140s to 90s range. He was sent to urgent care for evaluation. He denies any chest pain palpitations or shortness of breath. He does note some cough and nasal congestion and has been taking DayQuil recently. He denies any prior history of elevated blood pressure but does note a family history of hypertension. He feels well otherwise.   Past Medical History  Diagnosis Date  . Seasonal allergies    Past Surgical History  Procedure Laterality Date  . Mandible fracture surgery    . Mandible fracture surgery     History  Substance Use Topics  . Smoking status: Never Smoker   . Smokeless tobacco: Not on file  . Alcohol Use: No   ROS as above Medications: No current facility-administered medications for this encounter.   Current Outpatient Prescriptions  Medication Sig Dispense Refill  . ipratropium (ATROVENT) 0.06 % nasal spray Place 2 sprays into both nostrils 4 (four) times daily. 15 mL 1  . [DISCONTINUED] diphenhydrAMINE (BENADRYL) 25 mg capsule Take 25 mg by mouth at bedtime as needed. For allergies     No Known Allergies   Exam:  BP 144/91 mmHg  Pulse 65  Temp(Src) 98.5 F (36.9 C) (Oral)  Resp 18  SpO2 96% Gen: Well NAD HEENT: EOMI,  MMM clear nasal discharge. Lungs: Normal work of breathing. CTABL Heart: RRR no MRG Abd: NABS, Soft. Nondistended, Nontender Exts: Brisk capillary refill, warm and well perfused.   No results found for this or any previous visit (from the past 24 hour(s)). No results found.  Assessment and Plan: 34 y.o. male with elevated blood pressure not yet diagnostic of hypertension. Suspect blood pressure elevation is due to [place]-type medications. Recommend patient follow-up with a primary care doctor in about a month for blood pressure recheck. We'll  prescribe Atrovent for rhinorrhea due to URI.  Discussed warning signs or symptoms. Please see discharge instructions. Patient expresses understanding.     Rodolph BongEvan S Jya [place], MD 07/05/14 [phone]

## 2014-09-18 ENCOUNTER — Encounter (HOSPITAL_COMMUNITY): Payer: Self-pay | Admitting: Emergency Medicine

## 2014-09-18 ENCOUNTER — Emergency Department (INDEPENDENT_AMBULATORY_CARE_PROVIDER_SITE_OTHER)
Admission: EM | Admit: 2014-09-18 | Discharge: 2014-09-18 | Disposition: A | Discharge status: Home / Self Care | Payer: Managed Care, Other (non HMO) | Source: Home / Self Care | Attending: Family Medicine | Admitting: Family Medicine

## 2014-09-18 DIAGNOSIS — K130 Diseases of lips: Secondary | ICD-10-CM

## 2014-09-18 MED ORDER — SULFAMETHOXAZOLE-TRIMETHOPRIM 800-160 MG PO TABS: 2.0000 | ORAL_TABLET | Freq: Two times a day (BID) | ORAL | Status: DC

## 2014-09-18 NOTE — Discharge Instructions (Signed)
Thank you for coming in today. ° ° °Abscess °An abscess is an infected area that contains a collection of pus and debris. It can occur in almost any part of the body. An abscess is also known as a furuncle or boil. °CAUSES  °An abscess occurs when tissue gets infected. This can occur from blockage of oil or sweat glands, infection of hair follicles, or a minor injury to the skin. As the body tries to fight the infection, pus collects in the area and creates pressure under the skin. This pressure causes pain. People with weakened immune systems have difficulty fighting infections and get certain abscesses more often.  °SYMPTOMS °Usually an abscess develops on the skin and becomes a painful mass that is red, warm, and tender. If the abscess forms under the skin, you may feel a moveable soft area under the skin. Some abscesses break open (rupture) on their own, but most will continue to get worse without care. The infection can spread deeper into the body and eventually into the bloodstream, causing you to feel ill.  °DIAGNOSIS  °Your caregiver will take your medical history and perform a physical exam. A sample of fluid may also be taken from the abscess to determine what is causing your infection. °TREATMENT  °Your caregiver may prescribe antibiotic medicines to fight the infection. However, taking antibiotics alone usually does not cure an abscess. Your caregiver may need to make a small cut (incision) in the abscess to drain the pus. In some cases, gauze is packed into the abscess to reduce pain and to continue draining the area. °HOME CARE INSTRUCTIONS  °· Only take over-the-counter or prescription medicines for pain, discomfort, or fever as directed by your caregiver. °· If you were prescribed antibiotics, take them as directed. Finish them even if you start to feel better. °· If gauze is used, follow your caregiver's directions for changing the gauze. °· To avoid spreading the infection: °¨ Keep your draining  abscess covered with a bandage. °¨ Wash your hands well. °¨ Do not share personal care items, towels, or whirlpools with others. °¨ Avoid skin contact with others. °· Keep your skin and clothes clean around the abscess. °· Keep all follow-up appointments as directed by your caregiver. °SEEK MEDICAL CARE IF:  °· You have increased pain, swelling, redness, fluid drainage, or bleeding. °· You have muscle aches, chills, or a general ill feeling. °· You have a fever. °MAKE SURE YOU:  °· Understand these instructions. °· Will watch your condition. °· Will get help right away if you are not doing well or get worse. °Document Released: 03/17/2005 Document Revised: 12/07/2011 Document Reviewed: 08/20/2011 °ExitCare® Patient Information ©2015 ExitCare, LLC. This information is not intended to replace advice given to you by your health care provider. Make sure you discuss any questions you have with your health care provider. ° °

## 2014-09-18 NOTE — ED Notes (Signed)
MD eval only  

## 2014-09-18 NOTE — ED Provider Notes (Signed)
Dale Guerrero is a 34 y.o. male who presents to Urgent Care today for lip abscess. Patient has swelling and tenderness from his right lower lip. Symptoms present for about a week. He had some spontaneous drainage yesterday. He notes crusting today. He thinks it's slightly better today than it was yesterday. No fevers or chills nausea vomiting or diarrhea. No treatment tried yet.   Past Medical History  Diagnosis Date  . Seasonal allergies    Past Surgical History  Procedure Laterality Date  . Mandible fracture surgery    . Mandible fracture surgery     History  Substance Use Topics  . Smoking status: Never Smoker   . Smokeless tobacco: Not on file  . Alcohol Use: No   ROS as above Medications: No current facility-administered medications for this encounter.   Current Outpatient Prescriptions  Medication Sig Dispense Refill  . ipratropium (ATROVENT) 0.06 % nasal spray Place 2 sprays into both nostrils 4 (four) times daily. 15 mL 1  . sulfamethoxazole-trimethoprim (SEPTRA DS) 800-160 MG per tablet Take 2 tablets by mouth 2 (two) times daily. 28 tablet 0  . [DISCONTINUED] diphenhydrAMINE (BENADRYL) 25 mg capsule Take 25 mg by mouth at bedtime as needed. For allergies     No Known Allergies   Exam:  BP 129/80 mmHg  Pulse 74  Temp(Src) 98.2 F (36.8 C) (Oral)  Resp 16  SpO2 99% Gen: Well NAD HEENT: EOMI,  MMM  Lip right lower lip swollen tender indurated no fluctuance. Small amount of pus expressed.   No results found for this or any previous visit (from the past 24 hour(s)). No results found.  Assessment and Plan: 34 y.o. male with lip abscess versus cellulitis. No obvious area to incise and drain. We'll use trial of Bactrim antibiotics. Return as needed.  Discussed warning signs or symptoms. Please see discharge instructions. Patient expresses understanding.     Rodolph BongEvan S Corey, MD 09/18/14 1249

## 2014-09-18 NOTE — ED Notes (Deleted)
C/o cold sx onset 3 weeks Sx include cough, congestion, chest tightness and nauseas Taking OTC cold meds w/no relief Alert, no signs of acute distress.  

## 2014-12-10 ENCOUNTER — Encounter (HOSPITAL_COMMUNITY): Payer: Self-pay | Admitting: Emergency Medicine

## 2014-12-10 ENCOUNTER — Emergency Department (HOSPITAL_COMMUNITY)
Admission: EM | Admit: 2014-12-10 | Discharge: 2014-12-10 | Disposition: A | Discharge status: Home / Self Care | Payer: Managed Care, Other (non HMO) | Source: Home / Self Care | Attending: Family Medicine | Admitting: Family Medicine

## 2014-12-10 DIAGNOSIS — H1013 Acute atopic conjunctivitis, bilateral: Secondary | ICD-10-CM | POA: Diagnosis not present

## 2014-12-10 MED ORDER — OLOPATADINE HCL 0.1 % OP SOLN: 1.0000 [drp] | Freq: Two times a day (BID) | OPHTHALMIC | Status: DC

## 2014-12-10 NOTE — ED Provider Notes (Signed)
CSN: [id number]     Arrival date & time 12/10/14  1306 History   First MD Initiated Contact with Patient 12/10/14 1348     Chief Complaint  Patient presents with  . Conjunctivitis   (Consider location/radiation/quality/duration/timing/severity/associated sxs/prior Treatment) HPI Comments: 34 y o male states that his son gave him pain. He is complaining of primarily redness itching in clear watery discharge from the right eye; less to the left eye. Sometime itching and puffy in the morning. He has a history of similar reaction to his eyes when he was diagnosed with allergies.  Patient is a 34 y.o. male presenting with conjunctivitis.  Conjunctivitis Pertinent negatives include no shortness of breath.    Past Medical History  Diagnosis Date  . Seasonal allergies    Past Surgical History  Procedure Laterality Date  . Mandible fracture surgery    . Mandible fracture surgery     History reviewed. No pertinent family history. History  Substance Use Topics  . Smoking status: Never Smoker   . Smokeless tobacco: Not on file  . Alcohol Use: No    Review of Systems  Constitutional: Negative.   HENT: Negative for congestion, ear discharge, ear pain, sinus pressure and sore throat.   Eyes: Positive for discharge, redness and itching. Negative for visual disturbance.  Respiratory: Negative for cough and shortness of breath.     Allergies  Review of patient's allergies indicates no known allergies.  Home Medications   Prior to Admission medications   Medication Sig Start Date End Date Taking? Authorizing Provider  ipratropium (ATROVENT) 0.06 % nasal spray Place 2 sprays into both nostrils 4 (four) times daily. 07/05/14   Rodolph Bong, MD  olopatadine (PATANOL) 0.1 % ophthalmic solution Place 1 drop into both eyes 2 (two) times daily. 12/10/14   Hayden Rasmussen, NP  sulfamethoxazole-trimethoprim (SEPTRA DS) 800-160 MG per tablet Take 2 tablets by mouth 2 (two) times daily. 09/18/14   Rodolph Bong, MD   BP 126/75 mmHg  Pulse 62  Temp(Src) 97.9 F (36.6 C) (Oral)  Resp 14  SpO2 100% Physical Exam  Constitutional: He appears well-developed and well-nourished. No distress.  HENT:  Right Ear: External ear normal.  Left Ear: External ear normal.  Mouth/Throat: No oropharyngeal exudate.  Eyes:  Right eye with upper and lower conjunctivitis. Minor scleral injection. No current drainage. Anterior chambers clear. No purulence. Left lower lid conjunctiva mildly erythematous.  Neck: Normal range of motion. Neck supple.  Cardiovascular: Normal rate.   Pulmonary/Chest: Effort normal. No respiratory distress.  Lymphadenopathy:    He has no cervical adenopathy.  Neurological: He is alert. He exhibits normal muscle tone.  Skin: Skin is warm and dry.  Nursing note and vitals reviewed.   ED Course  Procedures (including critical care time) Labs Review Labs Reviewed - No data to display  Imaging Review No results found.   MDM   1. Allergic conjunctivitis, bilateral    patanol eye drops    Hayden Rasmussen, NP 12/10/14 1431

## 2014-12-10 NOTE — Discharge Instructions (Signed)
Allergic Conjunctivitis  The conjunctiva is a thin membrane that covers the visible white part of the eyeball and the underside of the eyelids. This membrane protects and lubricates the eye. The membrane has small blood vessels running through it that can normally be seen. When the conjunctiva becomes inflamed, the condition is called conjunctivitis. In response to the inflammation, the conjunctival blood vessels become swollen. The swelling results in redness in the normally white part of the eye.  The blood vessels of this membrane also react when a person has allergies and is then called allergic conjunctivitis. This condition usually lasts for as long as the allergy persists. Allergic conjunctivitis cannot be passed to another person (non-contagious). The likelihood of bacterial infection is great and the cause is not likely due to allergies if the inflamed eye has:  · A sticky discharge.  · Discharge or sticking together of the lids in the morning.  · Scaling or flaking of the eyelids where the eyelashes come out.  · Red swollen eyelids.  CAUSES   · Viruses.  · Irritants such as foreign bodies.  · Chemicals.  · General allergic reactions.  · Inflammation or serious diseases in the inside or the outside of the eye or the orbit (the boney cavity in which the eye sits) can cause a "red eye."  SYMPTOMS   · Eye redness.  · Tearing.  · Itchy eyes.  · Burning feeling in the eyes.  · Clear drainage from the eye.  · Allergic reaction due to pollens or ragweed sensitivity. Seasonal allergic conjunctivitis is frequent in the spring when pollens are in the air and in the fall.  DIAGNOSIS   This condition, in its many forms, is usually diagnosed based on the history and an ophthalmological exam. It usually involves both eyes. If your eyes react at the same time every year, allergies may be the cause. While most "red eyes" are due to allergy or an infection, the role of an eye (ophthalmological) exam is important. The exam  can rule out serious diseases of the eye or orbit.  TREATMENT   · Non-antibiotic eye drops, ointments, or medications by mouth may be prescribed if the ophthalmologist is sure the conjunctivitis is due to allergies alone.  · Over-the-counter drops and ointments for allergic symptoms should be used only after other causes of conjunctivitis have been ruled out, or as your caregiver suggests.  Medications by mouth are often prescribed if other allergy-related symptoms are present. If the ophthalmologist is sure that the conjunctivitis is due to allergies alone, treatment is normally limited to drops or ointments to reduce itching and burning.  HOME CARE INSTRUCTIONS   · Wash hands before and after applying drops or ointments, or touching the inflamed eye(s) or eyelids.  · Do not let the eye dropper tip or ointment tube touch the eyelid when putting medicine in your eye.  · Stop using your soft contact lenses and throw them away. Use a new pair of lenses when recovery is complete. You should run through sterilizing cycles at least three times before use after complete recovery if the old soft contact lenses are to be used. Hard contact lenses should be stopped. They need to be thoroughly sterilized before use after recovery.  · Itching and burning eyes due to allergies is often relieved by using a cool cloth applied to closed eye(s).  SEEK MEDICAL CARE IF:   · Your problems do not go away after two or three days of treatment.  ·   Your lids are sticky (especially in the morning when you wake up) or stick together.  · Discharge develops. Antibiotics may be needed either as drops, ointment, or by mouth.  · You have extreme light sensitivity.  · An oral temperature above 102° F (38.9° C) develops.  · Pain in or around the eye or any other visual symptom develops.  MAKE SURE YOU:   · Understand these instructions.  · Will watch your condition.  · Will get help right away if you are not doing well or get worse.  Document  Released: 08/28/2002 Document Revised: 08/30/2011 Document Reviewed: 07/24/2007  [employer]® Patient Information ©2015 [employer]. This information is not intended to replace advice given to you by your health care provider. Make sure you discuss any questions you have with your health care provider.

## 2014-12-10 NOTE — ED Notes (Signed)
Pt states that his 34 year old son had pink eye. And he now believes he has contacted it in his right eye. He states that it drains and when he wakes up it closed shut d/t drainage

## 2016-12-17 ENCOUNTER — Emergency Department (HOSPITAL_COMMUNITY)
Admission: EM | Admit: 2016-12-17 | Discharge: 2016-12-17 | Disposition: A | Discharge status: Home / Self Care | Payer: Managed Care, Other (non HMO) | Attending: Emergency Medicine | Admitting: Emergency Medicine

## 2016-12-17 ENCOUNTER — Encounter (HOSPITAL_COMMUNITY): Payer: Self-pay | Admitting: Emergency Medicine

## 2016-12-17 DIAGNOSIS — L0291 Cutaneous abscess, unspecified: Secondary | ICD-10-CM

## 2016-12-17 DIAGNOSIS — Z79899 Other long term (current) drug therapy: Secondary | ICD-10-CM | POA: Diagnosis not present

## 2016-12-17 DIAGNOSIS — K651 Peritoneal abscess: Secondary | ICD-10-CM | POA: Insufficient documentation

## 2016-12-17 MED ORDER — SULFAMETHOXAZOLE-TRIMETHOPRIM 800-160 MG PO TABS: 2.0000 | ORAL_TABLET | Freq: Two times a day (BID) | ORAL | 0 refills | Status: DC

## 2016-12-17 NOTE — ED Provider Notes (Signed)
[facility]-EMERGENCY DEPT Provider Note   CSN: [id number] Arrival date & time: 12/17/16  4098     History   Chief Complaint Chief Complaint  Patient presents with  . Abscess    HPI Dale Guerrero is a 36 y.o. male with abscess to pelvic region  x 3 days ago. Associated with mild redness, pain swelling. No associated fevers, penile discharge, dysuria or hematuria. No spontaneous discharge. Patient sexually active with women (wife) only, no condom use. No recent partners. Pt not concerned for STD today. Has h/o abscess in the past x 2, once in testicle before. Notes symptoms are similar to previous abscesses. Has tried warm compresses. No h/o IVDU.  HPI  Past Medical History:  Diagnosis Date  . Seasonal allergies     There are no active problems to display for this patient.   Past Surgical History:  Procedure Laterality Date  . MANDIBLE FRACTURE SURGERY    . MANDIBLE FRACTURE SURGERY         Home Medications    Prior to Admission medications   Medication Sig Start Date End Date Taking? Authorizing Provider  ipratropium (ATROVENT) 0.06 % nasal spray Place 2 sprays into both nostrils 4 (four) times daily. 07/05/14   Rodolph Bong, MD  olopatadine (PATANOL) 0.1 % ophthalmic solution Place 1 drop into both eyes 2 (two) times daily. 12/10/14   Hayden Rasmussen, NP  sulfamethoxazole-trimethoprim (SEPTRA DS) 800-160 MG tablet Take 2 tablets by mouth 2 (two) times daily. 12/17/16   Liberty Handy, PA-C    Family History No family history on file.  Social History Social History  Substance Use Topics  . Smoking status: Never Smoker  . Smokeless tobacco: Never Used  . Alcohol use No     Allergies   Patient has no known allergies.   Review of Systems Review of Systems  Constitutional: Negative for fever.  Genitourinary: Positive for scrotal swelling. Negative for difficulty urinating, discharge, dysuria, frequency, hematuria, penile pain, penile swelling and testicular pain.    Musculoskeletal: Negative for back pain.  Skin: Positive for color change.     Physical Exam Updated Vital Signs BP 127/74   Pulse (!) 58   Temp 97.7 F (36.5 C) (Oral)   Resp 18   Ht 5' 9.5" (1.765 m)   Wt 84.4 kg (186 lb)   SpO2 96%   BMI 27.07 kg/m   Physical Exam  Constitutional: He is oriented to person, place, and time. He appears well-developed and well-nourished. No distress.  NAD.  HENT:  Head: Normocephalic and atraumatic.  Right Ear: External ear normal.  Left Ear: External ear normal.  Nose: Nose normal.  Eyes: Conjunctivae and EOM are normal. No scleral icterus.  Neck: Normal range of motion. Neck supple.  Cardiovascular: Normal rate, regular rhythm, normal heart sounds and intact distal pulses.   No murmur heard. Pulmonary/Chest: Effort normal and breath sounds normal. He has no wheezes.  Genitourinary:  Genitourinary Comments: Circumcised male.  No groin lymphadenopathy.  No meatus discharge.  Glans and shaft smooth without tenderness, lesions, masses or deformity.  Scrotum without lesions or edema.  Non tender testicles. Epididymis and spermatic cord without tenderness or masses, bilaterally.  Musculoskeletal: Normal range of motion. He exhibits no deformity.  Neurological: He is alert and oriented to person, place, and time.  Skin: Skin is warm and dry. Capillary refill takes less than 2 seconds. Lesion noted.  2 cm area of mild induration, erythema and tenderness over pubic symphysis without  significant surrounding cellulitis.   Psychiatric: He has a normal mood and affect. His behavior is normal. Judgment and thought content normal.  Nursing note and vitals reviewed.    ED Treatments / Results  Labs (all labs ordered are listed, but only abnormal results are displayed) Labs Reviewed - No data to display  EKG  EKG Interpretation None       Radiology No results found.  Procedures Procedures (including critical care time)  Medications  Ordered in ED Medications - No data to display   Initial Impression / Assessment and Plan / ED Course  I have reviewed the triage vital signs and the nursing notes.  Pertinent labs & imaging results that were available during my care of the patient were reviewed by me and considered in my medical decision making (see chart for details).     36 y.o. yo male with abscess vs cellulitis. No associated fever or other systemic symptoms. Clinical picture not consistent with STD or chancre. Area of erythema, edema, tenderness is very small <2cm, I do not think I&D would have provided adequate evacuation of purulent discharge.  Will discharge with warm compresses, NSAIDs and abx.  Patient aware of symptoms that would warrant return to ED for re-evaluation and treatment. Patient verbalized understanding and is agreeable with plan.    Final Clinical Impressions(s) / ED Diagnoses   Final diagnoses:  Abscess    New Prescriptions Current Discharge Medication List       Jerrell MylarGibbons, Claudia J, PA-C 12/17/16 [id number]    Raeford RazorKohut, Stephen, MD 12/24/16 1900

## 2016-12-17 NOTE — Discharge Instructions (Signed)
You have a very small abscess, possibly cellulitis without abscess formed yet.  Given area and size, incision and drainage was not thought to be indicated today. I don't think we would obtain adequate amount of discharge today. Take antibiotic as prescribed. For pain take 600 mg of ibuprofen every 8 hours. Do warm compresses or place a heating pad to the area to increase blood flow and localized infection. Monitor for symptoms of worsening infection including increased swelling, redness, pain, purulent discharge, fever.

## 2016-12-17 NOTE — ED Triage Notes (Signed)
Pt reports an abscess at the base of his penis/left genital area the size of a pea for the past three days. Pt reports 4/10 pain sitting, irritation, redness and painful to touch.  Pt also reports toenail fungal infection that he was taking antibiotics for but pt reports he has not completed the antibiotics and has been unable to take them the past two days.

## 2017-06-02 ENCOUNTER — Encounter: Payer: 59 | Admitting: Podiatry

## 2017-06-02 ENCOUNTER — Ambulatory Visit: Payer: 59

## 2017-06-02 DIAGNOSIS — M659 Synovitis and tenosynovitis, unspecified: Secondary | ICD-10-CM

## 2017-06-11 NOTE — Progress Notes (Signed)
No show

## 2017-06-24 ENCOUNTER — Ambulatory Visit (INDEPENDENT_AMBULATORY_CARE_PROVIDER_SITE_OTHER): Payer: 59 | Admitting: Podiatry

## 2017-06-24 ENCOUNTER — Ambulatory Visit (INDEPENDENT_AMBULATORY_CARE_PROVIDER_SITE_OTHER): Payer: 59

## 2017-06-24 DIAGNOSIS — M659 Synovitis and tenosynovitis, unspecified: Secondary | ICD-10-CM | POA: Diagnosis not present

## 2017-06-24 DIAGNOSIS — B351 Tinea unguium: Secondary | ICD-10-CM

## 2017-06-24 DIAGNOSIS — B353 Tinea pedis: Secondary | ICD-10-CM | POA: Diagnosis not present

## 2017-06-24 DIAGNOSIS — Z79899 Other long term (current) drug therapy: Secondary | ICD-10-CM

## 2017-06-24 LAB — CBC WITH DIFFERENTIAL/PLATELET
BASOS PCT: 0.9 %
Basophils Absolute: 52 cells/uL (ref 0–200)
EOS PCT: 2.1 %
Eosinophils Absolute: 122 cells/uL (ref 15–500)
HCT: 45.5 % (ref 38.5–50.0)
Hemoglobin: 15.2 g/dL (ref 13.2–17.1)
LYMPHS ABS: 1914 {cells}/uL (ref 850–3900)
MCH: 29.8 pg (ref 27.0–33.0)
MCHC: 33.4 g/dL (ref 32.0–36.0)
MCV: 89.2 fL (ref 80.0–100.0)
MPV: 11.1 fL (ref 7.5–12.5)
Monocytes Relative: 6.5 %
NEUTROS PCT: 57.5 %
Neutro Abs: 3335 cells/uL (ref 1500–7800)
Platelets: 173 10*3/uL (ref 140–400)
RBC: 5.1 10*6/uL (ref 4.20–5.80)
RDW: 11.9 % (ref 11.0–15.0)
Total Lymphocyte: 33 %
WBC: 5.8 10*3/uL (ref 3.8–10.8)
WBCMIX: 377 {cells}/uL (ref 200–950)

## 2017-06-24 LAB — HEPATIC FUNCTION PANEL
AG Ratio: 1.6 (calc) (ref 1.0–2.5)
ALBUMIN MSPROF: 3.9 g/dL (ref 3.6–5.1)
ALT: 13 U/L (ref 9–46)
AST: 16 U/L (ref 10–40)
Alkaline phosphatase (APISO): 71 U/L (ref 40–115)
BILIRUBIN DIRECT: 0.1 mg/dL (ref 0.0–0.2)
BILIRUBIN TOTAL: 0.4 mg/dL (ref 0.2–1.2)
GLOBULIN: 2.4 g/dL (ref 1.9–3.7)
Indirect Bilirubin: 0.3 mg/dL (calc) (ref 0.2–1.2)
Total Protein: 6.3 g/dL (ref 6.1–8.1)

## 2017-06-24 MED ORDER — TERBINAFINE HCL 250 MG PO TABS: 250.0000 mg | ORAL_TABLET | Freq: Every day | ORAL | 0 refills | Status: DC

## 2017-06-24 NOTE — Patient Instructions (Signed)

## 2017-06-26 NOTE — Progress Notes (Signed)
Subjective:   Patient ID: Dale Guerrero, male   DOB: 37 y.o.   MRN: [id number]   HPI 37 year old male presents the office today for concerns of thick, discolored toenails with yellow-brown discoloration as well as her athlete's feet.  He is given cream he was using for the athlete's foot which is not been helping.  He previously was on Lamisil he only took 3 days of the medication because he got sick with another issue he stopped the medication.  He denies any pain in the nails and he denies any redness or drainage or any swelling.  He has no other concerns today.   Review of Systems  All other systems reviewed and are negative.  Past Medical History:  Diagnosis Date  . Seasonal allergies     Past Surgical History:  Procedure Laterality Date  . MANDIBLE FRACTURE SURGERY    . MANDIBLE FRACTURE SURGERY       Current Outpatient Medications:  .  ipratropium (ATROVENT) 0.06 % nasal spray, Place 2 sprays into both nostrils 4 (four) times daily., Disp: 15 mL, Rfl: 1 .  olopatadine (PATANOL) 0.1 % ophthalmic solution, Place 1 drop into both eyes 2 (two) times daily., Disp: 5 mL, Rfl: 0 .  sulfamethoxazole-trimethoprim (SEPTRA DS) 800-160 MG tablet, Take 2 tablets by mouth 2 (two) times daily., Disp: 28 tablet, Rfl: 0 .  terbinafine (LAMISIL) 250 MG tablet, Take 1 tablet (250 mg total) by mouth daily., Disp: 90 tablet, Rfl: 0  No Known Allergies  Social History   Socioeconomic History  . Marital status: Single    Spouse name: Not on file  . Number of children: Not on file  . Years of education: Not on file  . Highest education level: Not on file  Social Needs  . Financial resource strain: Not on file  . Food insecurity - worry: Not on file  . Food insecurity - inability: Not on file  . Transportation needs - medical: Not on file  . Transportation needs - non-medical: Not on file  Occupational History  . Not on file  Tobacco Use  . Smoking status: Never Smoker  . Smokeless  tobacco: Never Used  Substance and Sexual Activity  . Alcohol use: No  . Drug use: Yes    Types: Marijuana  . Sexual activity: Not on file  Other Topics Concern  . Not on file  Social History Narrative  . Not on file        Objective:  Physical Exam  General: AAO x3, NAD  Dermatological: The nails most notably bilateral hallux toenails appear to have some ill-defined discoloration are hypertrophic and dystrophic move the distal two thirds of the nail.  There is no pain in the nails there is no surrounding redness or drainage.  There is no swelling.  There is no open lesion identified.  There is evidence of tinea pedis along all the interspaces although mild.  Vascular: Dorsalis Pedis artery and Posterior Tibial artery pedal pulses are 2/4 bilateral with immedate capillary fill time. . There is no pain with calf compression, swelling, warmth, erythema.   Neruologic: Grossly intact via light touch bilateral.  Protective threshold with Semmes Wienstein monofilament intact to all pedal sites bilateral.   Musculoskeletal: No areas of tenderness identified bilaterally.  Muscular strength 5/5 in all groups tested bilateral.  Gait: Unassisted, Nonantalgic.      Assessment:   Onychomycosis, tinea pedis    Plan:  -Treatment options discussed including all alternatives, risks, and  complications -Etiology of symptoms were discussed -We discussed treatment options for both these issues today.  After discussion he elected to proceed with oral therapy.  He has no risk factors for any side effects.  We discussed the success rates as well as the duration.  I went ahead and prescribed 90 days of Lamisil and I ordered a CBC as well as LFT.  Do not start the medications I call him the results of the blood work he verbally understood this. -Follow-up in 6 weeks or sooner if needed.  Call any questions or concerns.  Vivi Barrack DPM

## 2017-06-28 ENCOUNTER — Telehealth: Payer: Self-pay | Admitting: *Deleted

## 2017-06-28 NOTE — Telephone Encounter (Signed)
I informed pt of Dr. Gabriel RungWagoner's review of results and orders. Pt stated understanding and states he called yesterday to see if he could take ibuprofen OTC for a problem with his hand. I told him it should be no problem if he took at a different time of day. Pt states he hasn't had to take any ibuprofen yet, but understands.

## 2017-06-28 NOTE — Telephone Encounter (Signed)
-----   Message from Dale BarrackMatthew R Wagoner, DPM sent at 06/27/2017  4:19 PM EST ----- Blood work is normal. Please let him know it is OK to start Lamisil. Thanks.

## 2017-07-01 ENCOUNTER — Telehealth: Payer: Self-pay | Admitting: Podiatry

## 2017-07-01 MED ORDER — TERBINAFINE HCL 250 MG PO TABS: 250.0000 mg | ORAL_TABLET | Freq: Every day | ORAL | 0 refills | Status: DC

## 2017-07-01 NOTE — Telephone Encounter (Signed)
I was told that I could go pick up my medicine. I don't know what pharmacy that y'all sent it to I [facility] [address] So if you can give me a call back at your earliest convenience I would appreciate it at [phone]. Thank you.

## 2017-07-01 NOTE — Addendum Note (Signed)
Addended by: Alphia Kava'CONNELL, VALERY D on: 07/01/2017 04:44 PM   Modules accepted: Orders

## 2017-07-01 NOTE — Telephone Encounter (Signed)
I informed pt I [facility] [id number].

## 2017-08-05 ENCOUNTER — Ambulatory Visit: Payer: 59 | Admitting: Podiatry

## 2017-08-08 ENCOUNTER — Encounter: Payer: Self-pay | Admitting: Podiatry

## 2017-08-08 ENCOUNTER — Ambulatory Visit (INDEPENDENT_AMBULATORY_CARE_PROVIDER_SITE_OTHER): Payer: 59 | Admitting: Podiatry

## 2017-08-08 DIAGNOSIS — B351 Tinea unguium: Secondary | ICD-10-CM | POA: Diagnosis not present

## 2017-08-08 DIAGNOSIS — Z79899 Other long term (current) drug therapy: Secondary | ICD-10-CM | POA: Diagnosis not present

## 2017-08-08 LAB — HEPATIC FUNCTION PANEL
AG Ratio: 1.7 (calc) (ref 1.0–2.5)
ALKALINE PHOSPHATASE (APISO): 74 U/L (ref 40–115)
ALT: 9 U/L (ref 9–46)
AST: 12 U/L (ref 10–40)
Albumin: 3.9 g/dL (ref 3.6–5.1)
BILIRUBIN INDIRECT: 0.2 mg/dL (ref 0.2–1.2)
Bilirubin, Direct: 0.1 mg/dL (ref 0.0–0.2)
GLOBULIN: 2.3 g/dL (ref 1.9–3.7)
TOTAL PROTEIN: 6.2 g/dL (ref 6.1–8.1)
Total Bilirubin: 0.3 mg/dL (ref 0.2–1.2)

## 2017-08-08 LAB — CBC WITH DIFFERENTIAL/PLATELET
BASOS PCT: 0.4 %
Basophils Absolute: 22 cells/uL (ref 0–200)
Eosinophils Absolute: 173 cells/uL (ref 15–500)
Eosinophils Relative: 3.2 %
HCT: 43.7 % (ref 38.5–50.0)
Hemoglobin: 15 g/dL (ref 13.2–17.1)
Lymphs Abs: 1949 cells/uL (ref 850–3900)
MCH: 30.4 pg (ref 27.0–33.0)
MCHC: 34.3 g/dL (ref 32.0–36.0)
MCV: 88.6 fL (ref 80.0–100.0)
MONOS PCT: 6.1 %
MPV: 11.2 fL (ref 7.5–12.5)
Neutro Abs: 2927 cells/uL (ref 1500–7800)
Neutrophils Relative %: 54.2 %
PLATELETS: 163 10*3/uL (ref 140–400)
RBC: 4.93 10*6/uL (ref 4.20–5.80)
RDW: 11.9 % (ref 11.0–15.0)
TOTAL LYMPHOCYTE: 36.1 %
WBC mixed population: 329 cells/uL (ref 200–950)
WBC: 5.4 10*3/uL (ref 3.8–10.8)

## 2017-08-09 ENCOUNTER — Telehealth: Payer: Self-pay | Admitting: *Deleted

## 2017-08-09 NOTE — Telephone Encounter (Signed)
Left message informing pt of Dr. Wagoner's review of results and orders. 

## 2017-08-09 NOTE — Telephone Encounter (Signed)
-----   Message from Vivi BarrackMatthew R Wagoner, DPM sent at 08/08/2017  5:15 PM EST ----- Ok to continue Lamisil. His labs are normal. Please let him know. Thanks.

## 2017-08-10 NOTE — Progress Notes (Signed)
Subjective: Dale Guerrero presents the office today for follow-up evaluation 6 weeks after starting Lamisil.  He denies any side effects the medication he is tolerating well.  He started to notice that the nails have started to clear somewhat.  Denies any pain or redness or drainage or any swelling or any other concerns today. Denies any systemic complaints such as fevers, chills, nausea, vomiting. No acute changes since last appointment, and no other complaints at this time.   Objective: AAO x3, NAD DP/PT pulses palpable bilaterally, CRT less than 3 seconds Nails are dystrophic, discolored yellow-brown discoloration.  There is clearing on the proximal nail corners.  No surrounding redness or drainage or any signs of infection.  No other open lesions.  No significant tinea pedis or interdigital maceration.   No open lesions or pre-ulcerative lesions.  No pain with calf compression, swelling, warmth, erythema  Assessment: Onychomycosis, currently on Lamisil  Plan: -All treatment options discussed with the patient including all alternatives, risks, complications.  -We will continue Lamisil for a total of 3 months.  Discussed side effects to continue to monitor for.  We will recheck a CBC and LFT.  Follow-up once he finishes the 3 months or sooner if needed.  Call any questions or concerns.  Discussed possibly fourth month if needed.  I did debride the nail so that any complications or bleeding as a courtesy. -Patient encouraged to call the office with any questions, concerns, change in symptoms.   Vivi BarrackMatthew R Artrell Lawless DPM

## 2017-09-19 ENCOUNTER — Ambulatory Visit: Payer: 59 | Admitting: Podiatry

## 2017-09-23 ENCOUNTER — Encounter: Payer: Self-pay | Admitting: Podiatry

## 2017-09-23 ENCOUNTER — Ambulatory Visit (INDEPENDENT_AMBULATORY_CARE_PROVIDER_SITE_OTHER): Payer: 59 | Admitting: Podiatry

## 2017-09-23 DIAGNOSIS — B351 Tinea unguium: Secondary | ICD-10-CM

## 2017-09-23 NOTE — Patient Instructions (Signed)
You can use fungi-nail on your toenails

## 2017-09-26 NOTE — Progress Notes (Signed)
Subjective: 37 year old male presents the office today for follow-up evaluation of onychomycosis.  He has finished his course of Lamisil.  He states the nails are looking much better denies any pain or swelling or any redness or drainage.  He has no new concerns. Denies any systemic complaints such as fevers, chills, nausea, vomiting. No acute changes since last appointment, and no other complaints at this time.   Objective: AAO x3, NAD DP/PT pulses palpable bilaterally, CRT less than 3 seconds The nails are significantly improved.  There is still some discoloration as well as hypertrophy to the distal portion of the nail with the proximal nail borders are clear about halfway up the nail.  There is no pain in the nails there is no swelling redness or drainage.  No tinea pedis is present. No open lesions or pre-ulcerative lesions.  No pain with calf compression, swelling, warmth, erythema  Assessment: Improving onychomycosis  Plan: -All treatment options discussed with the patient including all alternatives, risks, complications.  -At this point especially course of oral therapy.  I recommend he continue topical treatment of the nails continue to grow out we discussed Fungi-Nail over-the-counter.  He can start with this.  Also discussed routine periodic trimming of his nails to help with this.  Should there be any worsening or recurrence of the fungus to call the office otherwise I will follow-up with him as needed. -Patient encouraged to call the office with any questions, concerns, change in symptoms.   Vivi BarrackMatthew R Amarion Portell DPM

## 2019-04-05 ENCOUNTER — Other Ambulatory Visit: Payer: Self-pay

## 2019-04-05 DIAGNOSIS — Z20822 Contact with and (suspected) exposure to covid-19: Secondary | ICD-10-CM

## 2019-04-06 LAB — NOVEL CORONAVIRUS, NAA: SARS-CoV-2, NAA: NOT DETECTED

## 2019-05-31 ENCOUNTER — Other Ambulatory Visit: Payer: Self-pay

## 2019-05-31 DIAGNOSIS — Z20822 Contact with and (suspected) exposure to covid-19: Secondary | ICD-10-CM

## 2019-06-02 LAB — NOVEL CORONAVIRUS, NAA: SARS-CoV-2, NAA: DETECTED — AB

## 2019-12-29 ENCOUNTER — Other Ambulatory Visit: Payer: Self-pay

## 2019-12-29 ENCOUNTER — Encounter (HOSPITAL_COMMUNITY): Payer: Self-pay | Admitting: Emergency Medicine

## 2019-12-29 ENCOUNTER — Emergency Department (HOSPITAL_COMMUNITY)
Admission: EM | Admit: 2019-12-29 | Discharge: 2019-12-30 | Disposition: A | Discharge status: Home / Self Care | Payer: BC Managed Care – PPO | Attending: Emergency Medicine | Admitting: Emergency Medicine

## 2019-12-29 DIAGNOSIS — Y999 Unspecified external cause status: Secondary | ICD-10-CM | POA: Diagnosis not present

## 2019-12-29 DIAGNOSIS — Y92410 Unspecified street and highway as the place of occurrence of the external cause: Secondary | ICD-10-CM | POA: Diagnosis not present

## 2019-12-29 DIAGNOSIS — Y9389 Activity, other specified: Secondary | ICD-10-CM | POA: Insufficient documentation

## 2019-12-29 DIAGNOSIS — Z79899 Other long term (current) drug therapy: Secondary | ICD-10-CM | POA: Diagnosis not present

## 2019-12-29 DIAGNOSIS — M25511 Pain in right shoulder: Secondary | ICD-10-CM | POA: Insufficient documentation
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
## 2019-12-30 ENCOUNTER — Emergency Department (HOSPITAL_COMMUNITY): Payer: BC Managed Care – PPO

## 2019-12-30 MED ORDER — NAPROXEN 375 MG PO TABS: 375.0000 mg | ORAL_TABLET | Freq: Two times a day (BID) | ORAL | 0 refills | Status: DC

## 2019-12-30 MED ORDER — CYCLOBENZAPRINE HCL 10 MG PO TABS: 10.0000 mg | ORAL_TABLET | Freq: Two times a day (BID) | ORAL | 0 refills | Status: DC | PRN

## 2019-12-30 MED ORDER — LIDOCAINE 5 % EX PTCH: 1.0000 | MEDICATED_PATCH | CUTANEOUS | 0 refills | Status: DC
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
## 2022-10-26 ENCOUNTER — Encounter: Payer: Self-pay | Admitting: Nurse Practitioner

## 2023-01-03 ENCOUNTER — Encounter: Payer: Self-pay | Admitting: Nurse Practitioner

## 2023-01-03 ENCOUNTER — Ambulatory Visit: Payer: BC Managed Care – PPO | Admitting: Nurse Practitioner

## 2023-01-03 VITALS — BP 120/80 | HR 86 | Ht 69.0 in | Wt 170.8 lb

## 2023-01-03 DIAGNOSIS — A048 Other specified bacterial intestinal infections: Secondary | ICD-10-CM | POA: Diagnosis not present

## 2023-01-03 DIAGNOSIS — R1030 Lower abdominal pain, unspecified: Secondary | ICD-10-CM | POA: Diagnosis not present
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
## 2023-01-03 NOTE — Progress Notes (Signed)
 Agree with assessment and plan as outlined.  

## 2023-01-03 NOTE — Patient Instructions (Addendum)
You have been scheduled for a CT scan of the abdomen and pelvis at [facility], 1st floor Radiology. You are scheduled on 01/13/23 at 11:30 am. You should arrive 2 hours prior to your appointment time for registration.  We are giving you 2 bottles of contrast today that you will need to drink before arriving for the exam. The solution may taste better if refrigerated so put them in the refrigerator when you get home, but do NOT add ice or any other liquid to this solution as that would dilute it. Shake well before drinking.   Please follow the written instructions below on the day of your exam:   1) Do not eat anything after 7:30 am (4 hours prior to your test)   2) Drink 1 bottle of contrast @ 9:30 am (2 hours prior to your exam)  Remember to shake well before drinking and do NOT pour over ice.     Drink 1 bottle of contrast @ 10:30 am (1 hour prior to your exam)   If you have any questions regarding your exam or if you need to reschedule, you may call [facility] at [phone] between the hours of 8:00 am and 5:00 pm, Monday-Friday.    Your provider has requested that you go to the basement level for lab work before leaving today. Press "B" on the elevator. The lab is located at the first door on the left as you exit the elevator.  Your provider has ordered "Diatherix" stool testing for you. You have received a kit from our office today containing all necessary supplies to complete this test. Please carefully read the stool collection instructions provided in the kit before opening the accompanying materials. In addition, be sure to place the label from the top left corner of the laboratory request sheet onto the "puritan opti-swab" tube that is supplied in the kit. This label should include your full name and date of birth. After completing the test, you should secure the purtian tube into the specimen biohazard bag. The laboratory request information sheet (including date and  time of specimen collection) should be placed into the outside pocket of the specimen biohazard bag and returned to the [facility] lab with 2 days of collection.   Avoid Ibuprofen.  Take Hyoscyamine- 1 tablet by mouth  Contact our office if symptoms worsen.  Due to recent changes in healthcare laws, you may see the results of your imaging and laboratory studies on MyChart before your provider has had a chance to review them.  We understand that in some cases there may be results that are confusing or concerning to you. Not all laboratory results come back in the same time frame and the provider may be waiting for multiple results in order to interpret others.  Please give [place] 48 hours in order for your provider to thoroughly review all the results before contacting the office for clarification of your results.   Thank you for trusting me with your gastrointestinal care!   Alcide Evener, CRNP

## 2023-01-06 ENCOUNTER — Telehealth: Payer: Self-pay

## 2023-01-06 NOTE — Telephone Encounter (Signed)
Dr. Adela Lank, refer to office visit 01/03/2023. Pls verify  if you want patient to receive 2nd treatment for H. Pylori at this time or do you advise doing an EGD first?

## 2023-01-06 NOTE — Telephone Encounter (Signed)
Pt's Diatherix stool test is positive for H pylori. Please advise.

## 2023-01-07 NOTE — Telephone Encounter (Signed)
Looks like he was treated with Amoxicillin / Clarithromycin based regimen before.  I don't think we need EGD just for H pylori test if he is not having upper tract symtoms.  Would treat with the following:  Omeprazole 20 mg 2 times a day x 14 d  Pepto Bismol 2 tabs (262 mg each) 4 times a day x 14 d  Metronidazole 250 mg 4 times a day x 14 d  doxycycline 100 mg 2 times a day x 14 d   Can you please have your nurse order this if no signfiicant interactions noted and let the patient know. Once done with therapy, the patient should wait one month and perform a stool study for H pylori antigen to ensure negative. The patient should avoid alcohol while taking Flagyl.

## 2023-01-09 NOTE — Telephone Encounter (Signed)
Dale Guerrero, pls contact patient with Dr. Lanetta Inch recommendations regarding H. Pylori tx below. Pls send in RX for H. Pylori regimen per Dr. Adela Lank and inform patient no alcohol while he takes these medications.   Send patient to our lab 4 weeks after completing the H. Pylori treatment to check H. Pylori stool antigen. THX.

## 2023-01-10 NOTE — Telephone Encounter (Signed)
Left message for pt to call back  °

## 2023-01-10 NOTE — Telephone Encounter (Signed)
Patient is returning your call.  

## 2023-01-11 ENCOUNTER — Other Ambulatory Visit: Payer: Self-pay

## 2023-01-11 DIAGNOSIS — A048 Other specified bacterial intestinal infections: Secondary | ICD-10-CM

## 2023-01-11 MED ORDER — BISMUTH SUBSALICYLATE 262 MG PO CHEW
524.0000 mg | CHEWABLE_TABLET | Freq: Four times a day (QID) | ORAL | 0 refills | Status: AC
Start: 2023-01-11 — End: 2023-01-25

## 2023-01-11 MED ORDER — OMEPRAZOLE 20 MG PO CPDR
20.0000 mg | DELAYED_RELEASE_CAPSULE | Freq: Two times a day (BID) | ORAL | 0 refills | Status: AC
Start: 2023-01-11 — End: 2023-01-25

## 2023-01-11 MED ORDER — DOXYCYCLINE HYCLATE 100 MG PO CAPS
100.0000 mg | ORAL_CAPSULE | Freq: Two times a day (BID) | ORAL | 0 refills | Status: AC
Start: 2023-01-11 — End: 2023-01-25

## 2023-01-11 MED ORDER — METRONIDAZOLE 250 MG PO TABS
250.0000 mg | ORAL_TABLET | Freq: Four times a day (QID) | ORAL | 0 refills | Status: AC
Start: 2023-01-11 — End: 2023-01-25

## 2023-01-11 NOTE — Telephone Encounter (Signed)
Pt was made aware of Dr. Adela Lank and Alcide Evener NP  recommendations: Prescriptions were sent to pt pharmacy: Pt made aware. Stool kit prepared for pt along with documentation needed to submit to retest for the H Pylori. Pt made aware to come by and get stool kit. 4 weeks after completion of the medication therapy. Pt verbalized understanding with all questions answered.

## 2023-01-13 ENCOUNTER — Ambulatory Visit (HOSPITAL_COMMUNITY)
Admission: RE | Admit: 2023-01-13 | Discharge: 2023-01-13 | Disposition: A | Discharge status: Home / Self Care | Payer: BC Managed Care – PPO | Source: Ambulatory Visit | Attending: Nurse Practitioner | Admitting: Nurse Practitioner

## 2023-01-13 DIAGNOSIS — A048 Other specified bacterial intestinal infections: Secondary | ICD-10-CM | POA: Insufficient documentation

## 2023-01-13 DIAGNOSIS — R1030 Lower abdominal pain, unspecified: Secondary | ICD-10-CM | POA: Diagnosis present

## 2023-01-13 MED ORDER — IOHEXOL 9 MG/ML PO SOLN
500.0000 mL | ORAL | Status: AC
  Administered 2023-01-13 (×2): 500 mL via ORAL

## 2023-01-13 MED ORDER — IOHEXOL 9 MG/ML PO SOLN
ORAL | Status: AC
  Filled 2023-01-13: qty 1000

## 2023-01-13 MED ORDER — IOHEXOL 300 MG/ML  SOLN
100.0000 mL | Freq: Once | INTRAMUSCULAR | Status: AC | PRN
  Administered 2023-01-13: 100 mL via INTRAVENOUS

## 2023-01-13 MED ORDER — SODIUM CHLORIDE (PF) 0.9 % IJ SOLN
INTRAMUSCULAR | Status: AC
  Filled 2023-01-13: qty 50

## 2023-01-19 ENCOUNTER — Other Ambulatory Visit: Payer: Self-pay

## 2023-01-19 DIAGNOSIS — R935 Abnormal findings on diagnostic imaging of other abdominal regions, including retroperitoneum: Secondary | ICD-10-CM

## 2023-01-19 DIAGNOSIS — R1030 Lower abdominal pain, unspecified: Secondary | ICD-10-CM

## 2023-01-19 DIAGNOSIS — K561 Intussusception: Secondary | ICD-10-CM

## 2023-01-28 ENCOUNTER — Other Ambulatory Visit (HOSPITAL_COMMUNITY): Payer: BC Managed Care – PPO

## 2023-02-14 ENCOUNTER — Other Ambulatory Visit: Payer: BC Managed Care – PPO

## 2023-02-17 ENCOUNTER — Other Ambulatory Visit: Payer: BC Managed Care – PPO

## 2023-02-18 ENCOUNTER — Ambulatory Visit (HOSPITAL_COMMUNITY)
Admission: RE | Admit: 2023-02-18 | Discharge: 2023-02-18 | Disposition: A | Discharge status: Home / Self Care | Payer: BC Managed Care – PPO | Source: Ambulatory Visit | Attending: Nurse Practitioner | Admitting: Nurse Practitioner

## 2023-02-18 ENCOUNTER — Encounter (HOSPITAL_COMMUNITY): Payer: Self-pay

## 2023-02-18 DIAGNOSIS — R935 Abnormal findings on diagnostic imaging of other abdominal regions, including retroperitoneum: Secondary | ICD-10-CM | POA: Diagnosis present

## 2023-02-18 DIAGNOSIS — K561 Intussusception: Secondary | ICD-10-CM | POA: Diagnosis present

## 2023-02-18 LAB — POCT I-STAT CREATININE: Creatinine, Ser: 1.4 mg/dL — ABNORMAL HIGH (ref 0.61–1.24)

## 2023-02-18 MED ORDER — BARIUM SULFATE 0.1 % PO SUSP
450.0000 mL | Freq: Once | ORAL | Status: AC
  Administered 2023-02-18: 450 mL via ORAL

## 2023-02-18 MED ORDER — SODIUM CHLORIDE (PF) 0.9 % IJ SOLN
INTRAMUSCULAR | Status: AC
  Filled 2023-02-18: qty 50

## 2023-02-18 MED ORDER — BARIUM SULFATE 0.1 % PO SUSP
ORAL | Status: AC
  Filled 2023-02-18: qty 3

## 2023-02-18 MED ORDER — IOHEXOL 300 MG/ML  SOLN
100.0000 mL | Freq: Once | INTRAMUSCULAR | Status: AC | PRN
  Administered 2023-02-18: 100 mL via INTRAVENOUS

## 2023-03-06 ENCOUNTER — Telehealth: Payer: Self-pay | Admitting: Nurse Practitioner

## 2023-03-06 NOTE — Telephone Encounter (Signed)
DD, please contact the patient let him know his Diatherix H. pylori stool test was negative.  Please let me know if he is having any upper abdominal pain or GERD symptoms.  Thank you

## 2023-03-07 NOTE — Telephone Encounter (Signed)
Contacted pt & Lvm to return call regarding results.

## 2023-03-14 ENCOUNTER — Other Ambulatory Visit: Payer: Self-pay

## 2023-03-14 ENCOUNTER — Telehealth: Payer: Self-pay | Admitting: Nurse Practitioner

## 2023-03-14 DIAGNOSIS — R1030 Lower abdominal pain, unspecified: Secondary | ICD-10-CM

## 2023-03-14 DIAGNOSIS — R935 Abnormal findings on diagnostic imaging of other abdominal regions, including retroperitoneum: Secondary | ICD-10-CM

## 2023-03-14 MED ORDER — HYOSCYAMINE SULFATE 0.125 MG SL SUBL
0.1250 mg | SUBLINGUAL_TABLET | Freq: Three times a day (TID) | SUBLINGUAL | 1 refills | Status: AC | PRN
Start: 2023-03-14 — End: ?

## 2023-03-14 NOTE — Telephone Encounter (Signed)
Patient called states he is still having issues with his stomach. Seeking further advise.

## 2023-03-14 NOTE — Telephone Encounter (Signed)
Viviann Spare, if Hyoscyamine was ineffective, pls send in RX for Dicyclomine 10 mg tab 1 p.o. every 8 hours as needed abdominal pain.  Patient can take Gaviscon 1 tablespoon 30 to 60 minutes before meals.  Dr. Adela Lank, refer to office visit 01/03/2023 and CTAP which showed possible intussusception and small bowel CT for follow-up showed a normal small bowel.  Please verify if you want the patient to be scheduled for diagnostic colonoscopy at this juncture.  Thank you

## 2023-03-14 NOTE — Telephone Encounter (Signed)
Pt made aware of Alcide Evener NP recommendations.  Prescription was sent to pharmacy. Pt made aware.  Pt verbalized understanding with all questions answered.

## 2023-03-14 NOTE — Telephone Encounter (Signed)
Pt made aware of Alcide Evener NP recommendations: Pt stated that the Hyoscyamine was effective and stated that he took it for 10 to 14 days. Pt stated that he would need another prescription.  Please advise

## 2023-03-14 NOTE — Telephone Encounter (Signed)
Dale Guerrero, ok to refill Hyoscyamine 0.125mg  one tab dissolve under tongue Q 8 hours PRN abdominal pain # 30, 1 refill THX.

## 2023-03-14 NOTE — Telephone Encounter (Signed)
Pt stated that he is having ongoing issues with his stomach. Pt states that he is continuing to have lower left abdominal pain before and after he eats. Pt stated that the pain does appear to be more before he eats. Pt stated that he is requesting something to coat his stomach and something to assist with the abdominal pain. Last BM today. Pt stated that his BM was soft and that he has not had a normal BM in over a month.  Please review and advise

## 2023-03-15 NOTE — Telephone Encounter (Signed)
Pt made aware of Dr. Adela Lank and Alcide Evener NP recommendations: Pt stated that he would request to set up an office visit. Pt was scheduled to see Dr. Adela Lank on 07/08/2023 at 8:10 AM. Pt made aware. Pt verbalized understanding with all questions answered.

## 2023-03-15 NOTE — Telephone Encounter (Signed)
Dale Guerrero, pls contact patient and verify if he wishes to schedule a diagnostic colonoscopy with Dr. Adela Lank. If he does not wish to pursue a colonoscopy at this time I recommend scheduling him for a follow up appt with Dr. Adela Lank in 3 months. Patient to contact office if symptoms worsen. THX.

## 2023-03-15 NOTE — Telephone Encounter (Signed)
Okay. Sounds like he is responding to hyocyamine. If he has persistent symptoms then colonoscopy would be next steps, up to him he is wishes to proceed with it. If so can schedule at the [facility]. Thanks

## 2023-06-30 ENCOUNTER — Telehealth: Payer: Self-pay

## 2023-06-30 NOTE — Telephone Encounter (Signed)
 Patient has appointment with Dr. Leigh next week but he had an EGD and Colonoscopy with [facility] 2 months ago in Nov 2024.  Called and left message for patient to call back to discuss. He does not need to see 2 GI specialists so we would like to confirm that he has transferred his care there and we will cancel his appointment for next week.

## 2023-07-08 ENCOUNTER — Ambulatory Visit: Payer: BC Managed Care – PPO | Admitting: Gastroenterology
